# Patient Record
Sex: Female | Born: 1945 | Race: White | Hispanic: No | Marital: Married | Smoking: Former smoker
Health system: Southern US, Community
[De-identification: ages and names within clinical notes are randomized; demographics above are authoritative.]

## PROBLEM LIST (undated history)

## (undated) DIAGNOSIS — Z8639 Personal history of other endocrine, nutritional and metabolic disease: Secondary | ICD-10-CM

## (undated) DIAGNOSIS — Z9221 Personal history of antineoplastic chemotherapy: Secondary | ICD-10-CM

## (undated) DIAGNOSIS — Z7729 Contact with and (suspected ) exposure to other hazardous substances: Secondary | ICD-10-CM

## (undated) DIAGNOSIS — R112 Nausea with vomiting, unspecified: Secondary | ICD-10-CM

## (undated) DIAGNOSIS — C679 Malignant neoplasm of bladder, unspecified: Secondary | ICD-10-CM

## (undated) DIAGNOSIS — E079 Disorder of thyroid, unspecified: Secondary | ICD-10-CM

## (undated) DIAGNOSIS — R7303 Prediabetes: Secondary | ICD-10-CM

## (undated) DIAGNOSIS — E669 Obesity, unspecified: Secondary | ICD-10-CM

## (undated) DIAGNOSIS — I471 Supraventricular tachycardia, unspecified: Secondary | ICD-10-CM

## (undated) DIAGNOSIS — Z86018 Personal history of other benign neoplasm: Secondary | ICD-10-CM

## (undated) DIAGNOSIS — Z9889 Other specified postprocedural states: Secondary | ICD-10-CM

## (undated) DIAGNOSIS — E039 Hypothyroidism, unspecified: Secondary | ICD-10-CM

## (undated) HISTORY — PX: BLADDER SURGERY: SHX569

## (undated) HISTORY — DX: Disorder of thyroid, unspecified: E07.9

---
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
## 2000-01-14 ENCOUNTER — Ambulatory Visit (HOSPITAL_COMMUNITY): Admission: RE | Admit: 2000-01-14 | Discharge: 2000-01-14 | Payer: Self-pay | Admitting: Gastroenterology

## 2004-03-03 ENCOUNTER — Ambulatory Visit: Payer: Self-pay | Admitting: Unknown Physician Specialty

## 2004-04-13 DIAGNOSIS — Z9221 Personal history of antineoplastic chemotherapy: Secondary | ICD-10-CM

## 2004-04-13 DIAGNOSIS — C679 Malignant neoplasm of bladder, unspecified: Secondary | ICD-10-CM

## 2004-04-13 HISTORY — DX: Personal history of antineoplastic chemotherapy: Z92.21

## 2004-04-13 HISTORY — DX: Malignant neoplasm of bladder, unspecified: C67.9

## 2004-11-18 ENCOUNTER — Ambulatory Visit: Payer: Self-pay | Admitting: Specialist

## 2005-03-13 ENCOUNTER — Ambulatory Visit: Payer: Self-pay | Admitting: Unknown Physician Specialty

## 2006-03-16 ENCOUNTER — Ambulatory Visit: Payer: Self-pay | Admitting: Unknown Physician Specialty

## 2006-05-11 ENCOUNTER — Ambulatory Visit: Payer: Self-pay | Admitting: Gastroenterology

## 2007-03-21 ENCOUNTER — Ambulatory Visit: Payer: Self-pay | Admitting: Unknown Physician Specialty

## 2008-03-22 ENCOUNTER — Ambulatory Visit: Payer: Self-pay | Admitting: Unknown Physician Specialty

## 2008-10-17 ENCOUNTER — Ambulatory Visit: Payer: Self-pay | Admitting: Unknown Physician Specialty

## 2008-11-11 ENCOUNTER — Ambulatory Visit: Payer: Self-pay | Admitting: Unknown Physician Specialty

## 2008-12-12 ENCOUNTER — Ambulatory Visit: Payer: Self-pay | Admitting: Unknown Physician Specialty

## 2009-01-11 ENCOUNTER — Ambulatory Visit: Payer: Self-pay | Admitting: Unknown Physician Specialty

## 2009-06-27 ENCOUNTER — Ambulatory Visit: Payer: Self-pay | Admitting: Unknown Physician Specialty

## 2009-09-16 DIAGNOSIS — D239 Other benign neoplasm of skin, unspecified: Secondary | ICD-10-CM

## 2009-09-16 HISTORY — DX: Other benign neoplasm of skin, unspecified: D23.9

## 2010-07-02 ENCOUNTER — Ambulatory Visit: Payer: Self-pay | Admitting: Unknown Physician Specialty

## 2011-06-17 ENCOUNTER — Ambulatory Visit: Payer: Self-pay | Admitting: Ophthalmology

## 2011-07-07 ENCOUNTER — Ambulatory Visit: Payer: Self-pay | Admitting: Internal Medicine

## 2011-07-08 ENCOUNTER — Ambulatory Visit: Payer: Self-pay | Admitting: Ophthalmology

## 2011-10-20 ENCOUNTER — Ambulatory Visit: Payer: Self-pay | Admitting: Gastroenterology

## 2011-10-22 LAB — PATHOLOGY REPORT

## 2012-06-01 ENCOUNTER — Ambulatory Visit: Payer: Medicare Other

## 2012-06-01 ENCOUNTER — Ambulatory Visit (INDEPENDENT_AMBULATORY_CARE_PROVIDER_SITE_OTHER): Payer: Medicare Other | Admitting: Family Medicine

## 2012-06-01 VITALS — BP 176/83 | HR 70 | Temp 97.9°F | Resp 16 | Ht 64.0 in | Wt 202.4 lb

## 2012-06-01 DIAGNOSIS — S0081XA Abrasion of other part of head, initial encounter: Secondary | ICD-10-CM

## 2012-06-01 DIAGNOSIS — M79641 Pain in right hand: Secondary | ICD-10-CM

## 2012-06-01 DIAGNOSIS — S0180XA Unspecified open wound of other part of head, initial encounter: Secondary | ICD-10-CM

## 2012-06-01 NOTE — Progress Notes (Signed)
Procedure Note: Verbal consent obtained from the patient.  Local anesthesia with 1 cc Lidocaine 1% with epinephrine.  Wound scrubbed with soap and water.  Wound explored.  No foreign bodies or deep structure injury noted.  Wound closed with 5 simple interrupted sutures of 5-0 prolene.  Area cleansed and dressed.  Wound care discussed.  Pt tolerated very well.

## 2012-06-01 NOTE — Patient Instructions (Signed)
Recheck in 1 week for your hand injury. Tylenol is ok if needed. Recheck for hand and sutures in 5 days. Cool compresses as needed.  Return to the clinic or go to the nearest emergency room if any of your symptoms worsen or new symptoms occur. WOUND CARE Please return in 5 days to have your stitches/staples removed or sooner if you have concerns. Marland Kitchen Keep area clean and dry for 24 hours. Do not remove bandage, if applied. . After 24 hours, remove bandage and wash wound gently with mild soap and warm water. Reapply a new bandage after cleaning wound, if directed. . Continue daily cleansing with soap and water until stitches/staples are removed. . Do not apply any ointments or creams to the wound while stitches/staples are in place, as this may cause delayed healing. . Notify the office if you experience any of the following signs of infection: Swelling, redness, pus drainage, streaking, fever >101.0 F . Notify the office if you experience excessive bleeding that does not stop after 15-20 minutes of constant, firm pressure.    HEAD INJURY If any of the following occur notify your physician or go to the Hospital Emergency Department: . Increased drowsiness, stupor or loss of consciousness . Restlessness or convulsions (fits) . Paralysis in arms or legs . Temperature above 100 F . Vomiting . Severe headache . Blood or clear fluid dripping from the nose or ears . Stiffness of the neck . Dizziness or blurred vision . Pulsating pain in the eye . Unequal pupils of eye . Personality changes . Any other unusual symptoms PRECAUTIONS . Keep head elevated at all times for the first 24 hours (Elevate mattress if pillow is ineffective) . Do not take tranquilizers, sedatives, narcotics or alcohol . Avoid aspirin. Use only acetaminophen (e.g. Tylenol) or ibuprofen (e.g. Advil) for relief of pain. Follow directions on the bottle for dosage. . Use ice packs for comfort Have parent,  spouse, or friend awaken patient every 5 hour(s) and assess tonight.  MEDICATIONS Use medications only as directed by your physician  Return to the clinic or go to the nearest emergency room if any of your symptoms worsen or new symptoms occur.

## 2012-06-01 NOTE — Progress Notes (Signed)
Subjective:    Patient ID: Heidi Richards, female    DOB: 1945-06-01, 67 y.o.   MRN: [id number]  HPI Heidi Richards is a 67 y.o. female Here after fall with multiple areas of concern. About 5pm today - looking at vehicels at [employer] - getting out of back seat - stepped on elevated platform? - stepped on edge - lost balance - and fell forward and to R side - hit R face - above eye and hit R hand and scraped knee. No LOC, no vision changes.  No N/V, no current dizziness or lightheadedness.  No headache.  No aspirin, or other blood thinners.   R knee - burns on surface, but only spore on skin, able to bend and straighten without difficulty.   R hand dominant.  R 5th finger abrasion and bruising, swelling in that finger.  No prior finger injury.   R face pain - hurts in cheek bone below R eye, burning sensation on skin below. No change in vision, no diplopia, no difficulty with motion of eyes in visual field. No amaurosis fugax, no change in floaters or visual field. No eye pain.   Last tetanus 2011.   Review of Systems  Eyes: Negative for photophobia, pain, discharge, redness and visual disturbance.  Skin: Positive for wound.  Neurological: Negative for dizziness, syncope, facial asymmetry, speech difficulty, weakness, light-headedness and headaches.  Hematological: Does not bruise/bleed easily.       Objective:   Physical Exam  Vitals reviewed. Constitutional: She is oriented to person, place, and time. She appears well-developed and well-nourished. No distress.  HENT:  Head: Head is with abrasion and with laceration. Head is without raccoon's eyes and without Battle's sign.    Right Ear: No mastoid tenderness. No middle ear effusion. No hemotympanum.  Left Ear: No mastoid tenderness.  No middle ear effusion. No hemotympanum.  Nose: No rhinorrhea or nasal septal hematoma. Right sinus exhibits no maxillary sinus tenderness and no frontal sinus tenderness. Left sinus exhibits no  maxillary sinus tenderness and no frontal sinus tenderness.  No malaocclusion, tmj nt, no jaw ttp, or pain with opening/closing jaw.   Pulmonary/Chest: Effort normal.  Musculoskeletal:       Right elbow: She exhibits normal range of motion, no swelling and no effusion. No tenderness found. No radial head tenderness noted.       Right wrist: She exhibits normal range of motion, no tenderness, no bony tenderness, no swelling and no deformity.       Right knee: She exhibits normal range of motion, no swelling, no effusion, no ecchymosis, no deformity and no bony tenderness. No tenderness found. No medial joint line, no lateral joint line, no MCL, no LCL and no patellar tendon tenderness noted.       Hands:      Legs: Neurological: She is alert and oriented to person, place, and time.  No focal weakness.   Skin: Skin is warm.  Wounds as above.   Psychiatric: She has a normal mood and affect. Her behavior is normal. Judgment and thought content normal.    [facility] reading (PRIMARY) by  Dr. Neva Seat: R hand:subtle, possible small nondispleced fracture at 5th [facility] base - incomplete, transverse.   dJd at 5th DIP - near fusion.  :     Assessment & Plan:  Heidi Richards is a 67 y.o. female  R hand pain, contusion with possible ND R 5th MC base fx. Will place in ulnar gutter splint - wrist at 30  until overread and plan on recheck in next 5 days.  Consider ortho eval as base of 5th, but appears stable at this point. Tylenol if needed.   TR knee abrasion - sx care, soap/water, polysporin if needed, rtc precautions.   Wound - face - repaired per procedure note. Discussed head injury precautions, ER precautions, but given current history and exam - deferred imaging at this point. See handout. Understanding expressed.    Patient Instructions  Recheck in 1 week for your hand injury. Tylenol is ok if needed. Recheck for hand and sutures in 5 days. Cool compresses as needed.  Return to the clinic or go to the  nearest emergency room if any of your symptoms worsen or new symptoms occur. WOUND CARE Please return in 5 days to have your stitches/staples removed or sooner if you have concerns. Marland Kitchen Keep area clean and dry for 24 hours. Do not remove bandage, if applied. . After 24 hours, remove bandage and wash wound gently with mild soap and warm water. Reapply a new bandage after cleaning wound, if directed. . Continue daily cleansing with soap and water until stitches/staples are removed. . Do not apply any ointments or creams to the wound while stitches/staples are in place, as this may cause delayed healing. . Notify the office if you experience any of the following signs of infection: Swelling, redness, pus drainage, streaking, fever >101.0 F . Notify the office if you experience excessive bleeding that does not stop after 15-20 minutes of constant, firm pressure.    HEAD INJURY If any of the following occur notify your physician or go to the Hospital Emergency Department: . Increased drowsiness, stupor or loss of consciousness . Restlessness or convulsions (fits) . Paralysis in arms or legs . Temperature above 100 F . Vomiting . Severe headache . Blood or clear fluid dripping from the nose or ears . Stiffness of the neck . Dizziness or blurred vision . Pulsating pain in the eye . Unequal pupils of eye . Personality changes . Any other unusual symptoms PRECAUTIONS . Keep head elevated at all times for the first 24 hours (Elevate mattress if pillow is ineffective) . Do not take tranquilizers, sedatives, narcotics or alcohol . Avoid aspirin. Use only acetaminophen (e.g. Tylenol) or ibuprofen (e.g. Advil) for relief of pain. Follow directions on the bottle for dosage. . Use ice packs for comfort Have parent, spouse, or friend awaken patient every 5 hour(s) and assess tonight.  MEDICATIONS Use medications only as directed by your physician  Return to the clinic or go to the  nearest emergency room if any of your symptoms worsen or new symptoms occur.

## 2012-06-07 ENCOUNTER — Ambulatory Visit (INDEPENDENT_AMBULATORY_CARE_PROVIDER_SITE_OTHER): Payer: Medicare Other | Admitting: Family Medicine

## 2012-06-07 VITALS — BP 165/82 | HR 63 | Temp 97.9°F | Resp 16 | Ht 64.75 in | Wt 201.0 lb

## 2012-06-07 DIAGNOSIS — Z5189 Encounter for other specified aftercare: Secondary | ICD-10-CM

## 2012-06-07 DIAGNOSIS — S80211D Abrasion, right knee, subsequent encounter: Secondary | ICD-10-CM

## 2012-06-07 NOTE — Patient Instructions (Addendum)
We will refer you to an orthopaedic doctor for your hand fracture.  Keep the splint on until seen by them.   allow the steristrips to fall off on their own., and avoid scrubbing this area next few days.  Continue soap and water for your right knee, and if any redness , or continued pain in the next 1-2 weeks - recheck.  Return to the clinic or go to the nearest emergency room if any of your symptoms worsen or new symptoms occur.

## 2012-06-07 NOTE — Progress Notes (Signed)
Subjective:    Patient ID: Heidi Richards, female    DOB: [birth year], 67 y.o.   MRN: [id number]  HPI Heidi Richards is a 67 y.o. female See prior ov, s/p fall on 2/19.   R eye injury  - had sutures removed today.  No visual difficulty. Bruising on face is changing to yellow green. Only sore in soft tissue around eye. No bony pain. No recent ha/lightheadedness, no dizziness.   R wrist pain - R hand pain, contusion with possible ND R 5th MC base fx.  XR reading confirmed nd fx: RIGHT HAND - COMPLETE 3+ VIEW  Comparison: None.  Findings: There is a nondisplaced fracture of the base of the fifth  metacarpal. There are moderate osteoarthritic type degenerative  changes mainly involving the DIP joints and most significantly  involving the second and fifth digits.  IMPRESSION:  1. Suspect fifth metatarsal base fracture.  2. Osteoarthritic type degenerative changes.   R  knee abrasion - sx care, soap/water, polysporin if needed, rtc precautions.     Review of Systems  Constitutional: Negative for fever and chills.  HENT: Negative for facial swelling.   Eyes: Negative for photophobia and visual disturbance.  Musculoskeletal: Positive for arthralgias.  Skin:       Bruising on face.        Objective:   Physical Exam  Constitutional: She is oriented to person, place, and time. She appears well-developed and well-nourished. No distress.  HENT:  Right Ear: External ear normal.  Left Ear: External ear normal.  Mouth/Throat: Oropharynx is clear and moist.  Resolving echymosis R face, without bony ttp.  Wound above r scalp with sutures removed,  No erythema or exudate, slight dried blood midline. Steri-strips applied x 2.   Eyes: Conjunctivae and EOM are normal. Pupils are equal, round, and reactive to light. Right eye exhibits no discharge. No scleral icterus.  Pulmonary/Chest: Effort normal.  Musculoskeletal:  R ulnar gutter splint intact, nvi distally.  Few healing abrasions over R knee,  no surrounding erythema. No bony ttp, and from,  Possible slight effusion. Able to ambulate without pain.   Neurological: She is alert and oriented to person, place, and time.      Assessment & Plan:  Heidi Richards is a 67 y.o. female Nondisp fx base fifth metacarpal bone right hand w/routine heal - Plan: Ambulatory referral to Orthopedic Surgery as base of 5th and possible instability, but initial fx nondisplaced. Cont brace and refer to ortho in [place] at her request.   Pain, hand joint, right  - as above.   Wound, open, face, subsequent encounter.  Sutures removed  Without difficulty, wound care precautions, rtc precautions.   Knee abrasion, right, subsequent encounter - contusion.abrasion. Possible slight effusion - ?flair of OA, but no bony ttp.  Recheck in next 2 weeks if still with swelling, sooner if pain or worsening.   Patient Instructions  We will refer you to an orthopaedic doctor for your hand fracture.  Keep the splint on until seen by them.   allow the steristrips to fall off on their own., and avoid scrubbing this area next few days.  Continue soap and water for your right knee, and if any redness , or continued pain in the next 1-2 weeks - recheck.  Return to the clinic or go to the nearest emergency room if any of your symptoms worsen or new symptoms occur.     No orders of the defined types were placed in this encounter.

## 2012-06-08 ENCOUNTER — Telehealth: Payer: Self-pay

## 2012-06-08 NOTE — Telephone Encounter (Signed)
Request given to xray 

## 2012-06-08 NOTE — Telephone Encounter (Signed)
Pt needs to pick up xrays of her hand to take to appt on Friday please call when ready to pick up

## 2012-07-07 ENCOUNTER — Ambulatory Visit: Payer: Self-pay

## 2012-11-28 ENCOUNTER — Ambulatory Visit: Payer: Self-pay

## 2012-12-12 ENCOUNTER — Ambulatory Visit: Payer: Self-pay

## 2013-01-11 ENCOUNTER — Ambulatory Visit: Payer: Self-pay

## 2013-06-07 IMAGING — MG MM CAD SCREENING MAMMO
1 series · 4 of 4 positions shown · non-contrast
Comparison: none

REASON FOR EXAM: SCR MAMMO NO ORDER
COMMENTS:

[R CC · right · 4 of 4 slices shown]
[im 1/4]
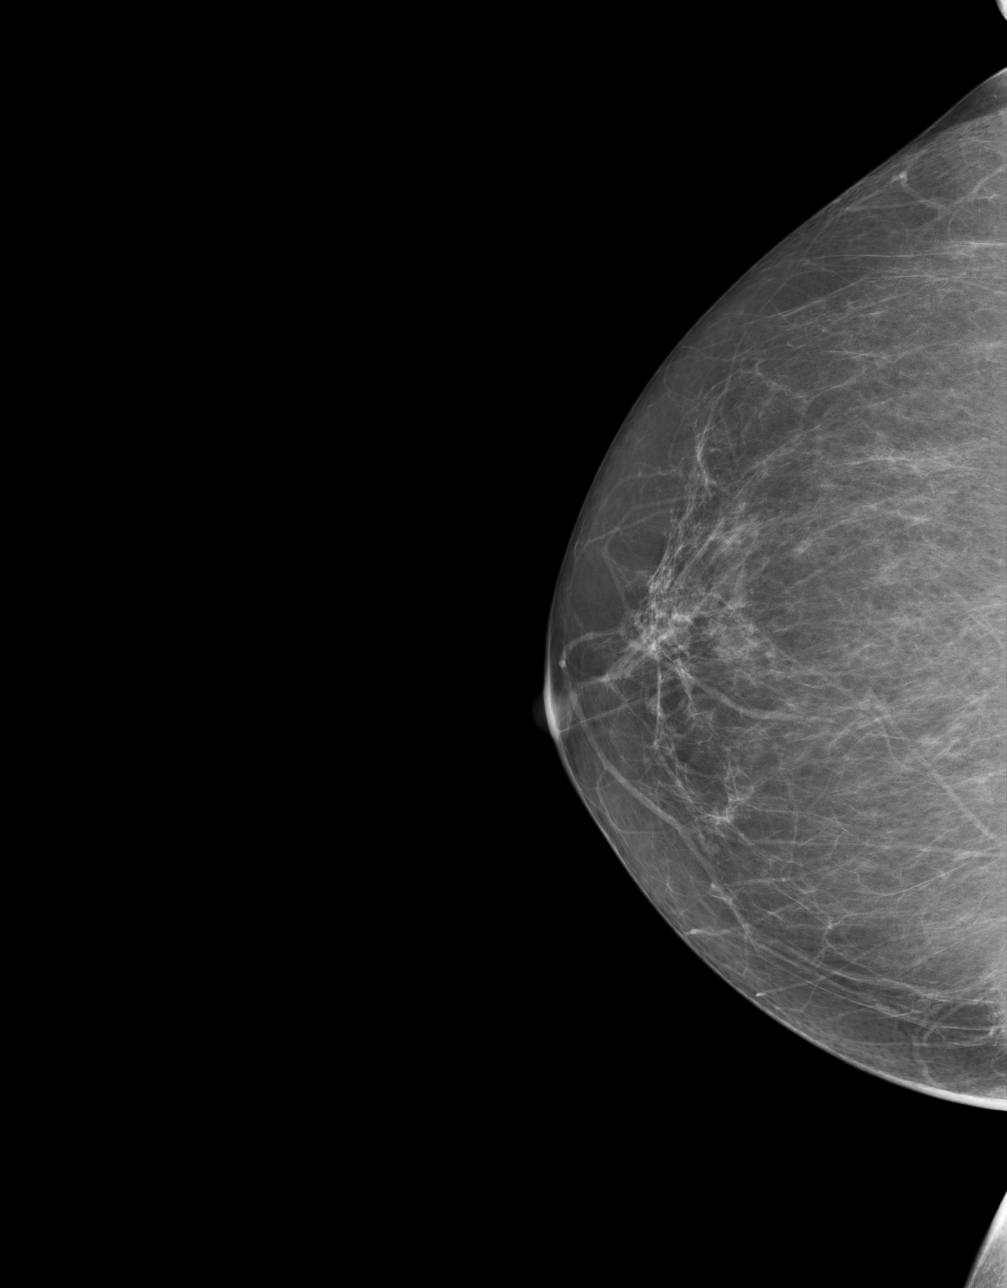
[im 2/4]
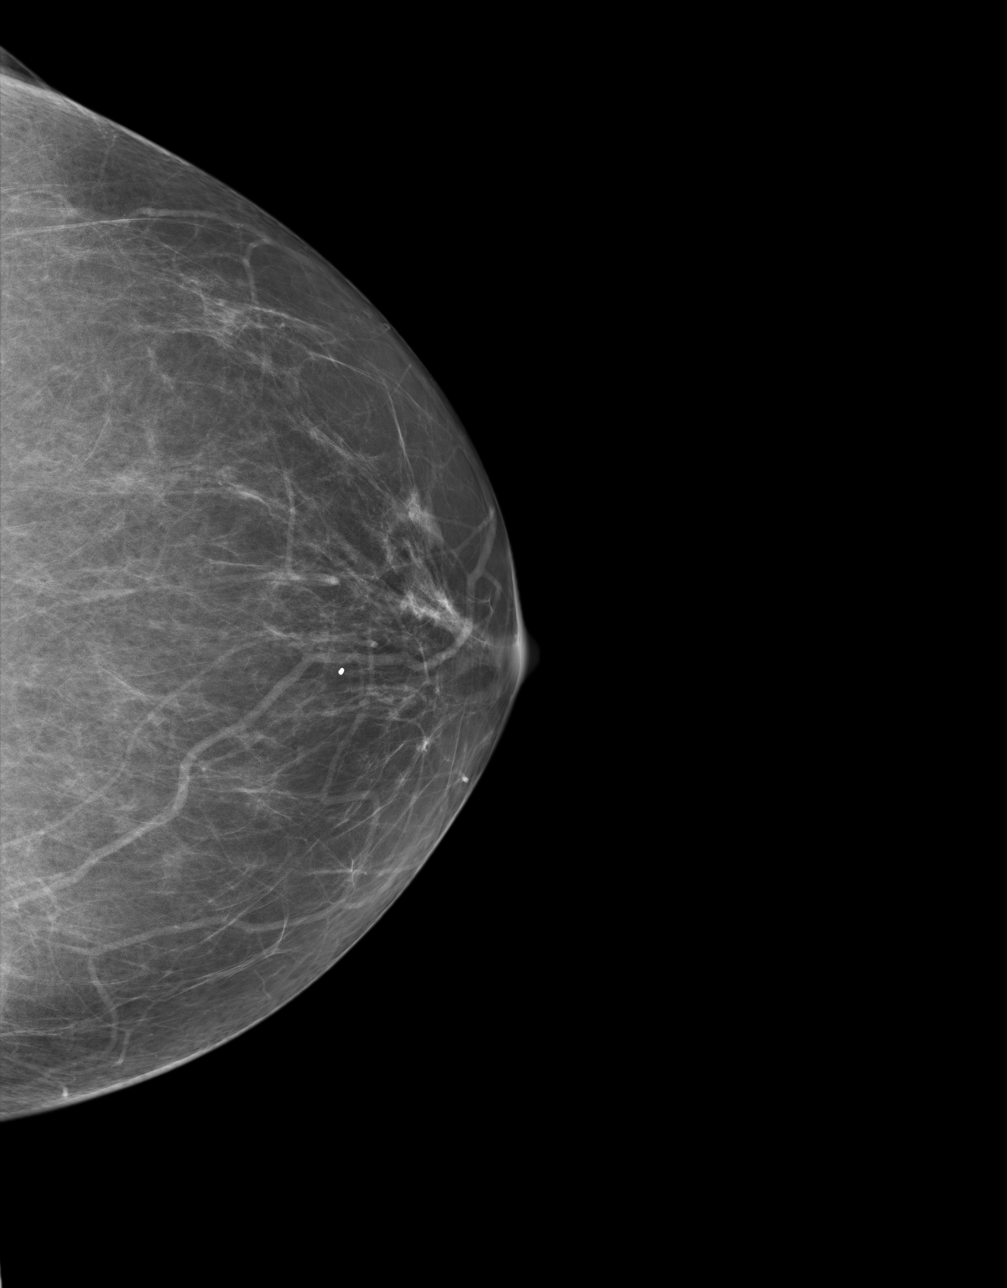
[im 3/4]
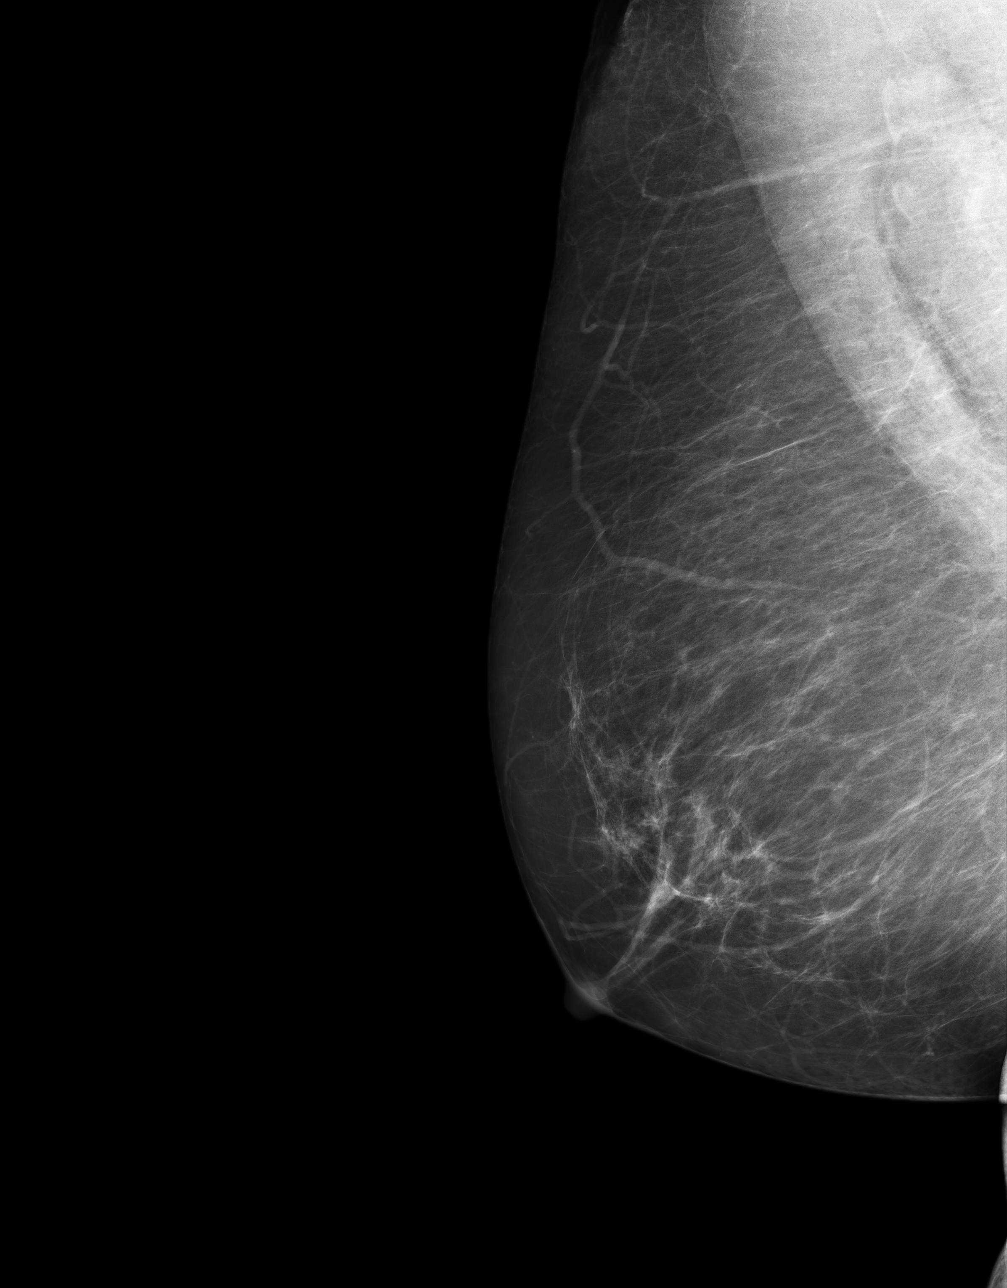
[im 4/4]
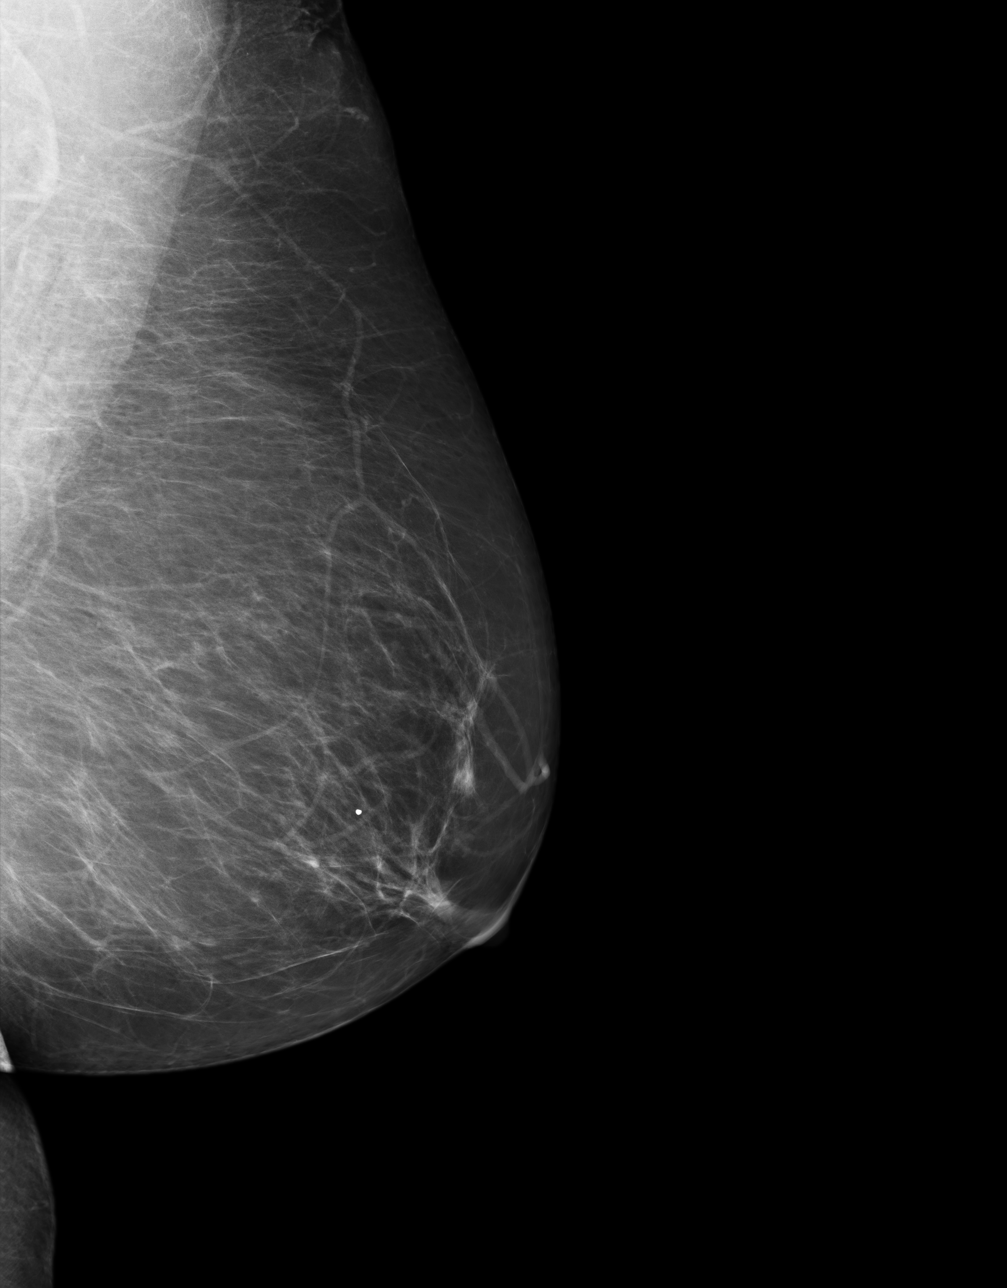

[4 of 4 positions shown; findings below may reference images not displayed]

PROCEDURE:     MAM - MAM DGTL SCRN MAM NO ORDER W/CAD  - July 07, 2012 [DATE]

RESULT:     There is a family history of breast cancer in the patient's
sister. The patient reports a history of bladder cancer. The patient denies
previous breast cancer history or breast surgery. Comparison is made to
previous digital images dated 07 July, 2011 as well as 02 July, 2010 and 02 March, 2003. There is predominant fatty replacement of the breast
parenchyma without malignant calcification, architectural distortion or
developing density.
IMPRESSION: Stable, benign appearing bilateral mammogram. Please
continue to encourage annual mammographic followup and monthly breast self
exam. BREAST COMPOSITION: The breast composition is ALMOST ENTIRELY FATTY
(glandular tissue is less than 25%) BI-RADS: Category 1 - Negative

A NEGATIVE MAMMOGRAM REPORT DOES NOT PRECLUDE BIOPSY OR OTHER EVALUATION OF
A CLINICALLY PALPABLE OR OTHERWISE SUSPICIOUS MASS OR LESION. BREAST CANCER
MAY NOT BE DETECTED IN UP TO 10% OF CASES.

[REDACTED]

## 2013-07-10 ENCOUNTER — Ambulatory Visit: Payer: Self-pay

## 2014-07-12 ENCOUNTER — Ambulatory Visit
Admit: 2014-07-12 | Disposition: A | Discharge status: Home / Self Care | Payer: Self-pay | Attending: Infectious Diseases | Admitting: Infectious Diseases

## 2015-07-04 ENCOUNTER — Other Ambulatory Visit: Payer: Self-pay | Admitting: Infectious Diseases

## 2015-07-04 DIAGNOSIS — Z1231 Encounter for screening mammogram for malignant neoplasm of breast: Secondary | ICD-10-CM

## 2015-07-15 ENCOUNTER — Other Ambulatory Visit: Payer: Self-pay | Admitting: Infectious Diseases

## 2015-07-15 ENCOUNTER — Ambulatory Visit
Admission: RE | Admit: 2015-07-15 | Discharge: 2015-07-15 | Disposition: A | Discharge status: Home / Self Care | Payer: Medicare Other | Source: Ambulatory Visit | Attending: Infectious Diseases | Admitting: Infectious Diseases

## 2015-07-15 DIAGNOSIS — Z1231 Encounter for screening mammogram for malignant neoplasm of breast: Secondary | ICD-10-CM | POA: Diagnosis not present

## 2016-07-14 ENCOUNTER — Other Ambulatory Visit: Payer: Self-pay | Admitting: Infectious Diseases

## 2016-07-14 DIAGNOSIS — Z1231 Encounter for screening mammogram for malignant neoplasm of breast: Secondary | ICD-10-CM

## 2016-07-17 ENCOUNTER — Ambulatory Visit
Admission: RE | Admit: 2016-07-17 | Discharge: 2016-07-17 | Disposition: A | Discharge status: Home / Self Care | Payer: Medicare Other | Source: Ambulatory Visit | Attending: Infectious Diseases | Admitting: Infectious Diseases

## 2016-07-17 DIAGNOSIS — Z1231 Encounter for screening mammogram for malignant neoplasm of breast: Secondary | ICD-10-CM | POA: Diagnosis present

## 2017-04-12 HISTORY — PX: EYE SURGERY: SHX253

## 2017-06-18 ENCOUNTER — Encounter: Payer: Self-pay | Admitting: *Deleted

## 2017-06-21 ENCOUNTER — Ambulatory Visit: Payer: Medicare Other | Admitting: Certified Registered Nurse Anesthetist

## 2017-06-21 ENCOUNTER — Encounter
Admission: RE | Disposition: A | Discharge status: Home / Self Care | Payer: Self-pay | Source: Ambulatory Visit | Attending: Gastroenterology

## 2017-06-21 ENCOUNTER — Other Ambulatory Visit: Payer: Self-pay

## 2017-06-21 ENCOUNTER — Ambulatory Visit
Admission: RE | Admit: 2017-06-21 | Discharge: 2017-06-21 | Disposition: A | Discharge status: Home / Self Care | Payer: Medicare Other | Source: Ambulatory Visit | Attending: Gastroenterology | Admitting: Gastroenterology

## 2017-06-21 DIAGNOSIS — Z79899 Other long term (current) drug therapy: Secondary | ICD-10-CM | POA: Insufficient documentation

## 2017-06-21 DIAGNOSIS — I10 Essential (primary) hypertension: Secondary | ICD-10-CM | POA: Insufficient documentation

## 2017-06-21 DIAGNOSIS — Z882 Allergy status to sulfonamides status: Secondary | ICD-10-CM | POA: Insufficient documentation

## 2017-06-21 DIAGNOSIS — Z86010 Personal history of colonic polyps: Secondary | ICD-10-CM | POA: Diagnosis not present

## 2017-06-21 DIAGNOSIS — E039 Hypothyroidism, unspecified: Secondary | ICD-10-CM | POA: Diagnosis not present

## 2017-06-21 DIAGNOSIS — Z881 Allergy status to other antibiotic agents status: Secondary | ICD-10-CM | POA: Insufficient documentation

## 2017-06-21 DIAGNOSIS — Z800 Family history of malignant neoplasm of digestive organs: Secondary | ICD-10-CM | POA: Diagnosis present

## 2017-06-21 DIAGNOSIS — I471 Supraventricular tachycardia: Secondary | ICD-10-CM | POA: Insufficient documentation

## 2017-06-21 DIAGNOSIS — Z8551 Personal history of malignant neoplasm of bladder: Secondary | ICD-10-CM | POA: Diagnosis not present

## 2017-06-21 DIAGNOSIS — R7303 Prediabetes: Secondary | ICD-10-CM | POA: Insufficient documentation

## 2017-06-21 DIAGNOSIS — Z87891 Personal history of nicotine dependence: Secondary | ICD-10-CM | POA: Diagnosis not present

## 2017-06-21 HISTORY — DX: Nausea with vomiting, unspecified: R11.2

## 2017-06-21 HISTORY — DX: Personal history of other endocrine, nutritional and metabolic disease: Z86.39

## 2017-06-21 HISTORY — DX: Supraventricular tachycardia: I47.1

## 2017-06-21 HISTORY — DX: Hypothyroidism, unspecified: E03.9

## 2017-06-21 HISTORY — DX: Prediabetes: R73.03

## 2017-06-21 HISTORY — DX: Contact with and (suspected) exposure to other hazardous substances: Z77.29

## 2017-06-21 HISTORY — PX: COLONOSCOPY WITH PROPOFOL: SHX5780

## 2017-06-21 HISTORY — DX: Supraventricular tachycardia, unspecified: I47.10

## 2017-06-21 HISTORY — DX: Malignant neoplasm of bladder, unspecified: C67.9

## 2017-06-21 HISTORY — DX: Obesity, unspecified: E66.9

## 2017-06-21 HISTORY — DX: Other specified postprocedural states: Z98.890

## 2017-06-21 LAB — GLUCOSE, CAPILLARY: Glucose-Capillary: 121 mg/dL — ABNORMAL HIGH (ref 65–99)

## 2017-06-21 SURGERY — COLONOSCOPY WITH PROPOFOL
Anesthesia: General

## 2017-06-21 MED ORDER — SODIUM CHLORIDE 0.9 % IV SOLN
INTRAVENOUS | Status: DC
  Administered 2017-06-21: 08:00:00 via INTRAVENOUS

## 2017-06-21 MED ORDER — KETAMINE HCL 50 MG/ML IJ SOLN
INTRAMUSCULAR | Status: AC
  Filled 2017-06-21: qty 10

## 2017-06-21 MED ORDER — ONDANSETRON HCL 4 MG/2ML IJ SOLN
INTRAMUSCULAR | Status: AC
  Filled 2017-06-21: qty 2

## 2017-06-21 MED ORDER — ONDANSETRON HCL 4 MG/2ML IJ SOLN
INTRAMUSCULAR | Status: DC | PRN
  Administered 2017-06-21: 4 mg via INTRAVENOUS

## 2017-06-21 MED ORDER — PROPOFOL 10 MG/ML IV BOLUS
INTRAVENOUS | Status: DC | PRN
  Administered 2017-06-21: 70 mg via INTRAVENOUS
  Administered 2017-06-21: 20 mg via INTRAVENOUS

## 2017-06-21 MED ORDER — LIDOCAINE HCL (PF) 2 % IJ SOLN
INTRAMUSCULAR | Status: AC
  Filled 2017-06-21: qty 10

## 2017-06-21 MED ORDER — PROPOFOL 10 MG/ML IV BOLUS
INTRAVENOUS | Status: AC
  Filled 2017-06-21: qty 20

## 2017-06-21 MED ORDER — LIDOCAINE HCL (CARDIAC) 20 MG/ML IV SOLN
INTRAVENOUS | Status: DC | PRN
  Administered 2017-06-21: 50 mg via INTRAVENOUS

## 2017-06-21 MED ORDER — ONDANSETRON HCL 4 MG/2ML IJ SOLN: 4.0000 mg | Freq: Once | INTRAMUSCULAR | Status: DC | PRN

## 2017-06-21 MED ORDER — FENTANYL CITRATE (PF) 100 MCG/2ML IJ SOLN: 25.0000 ug | INTRAMUSCULAR | Status: DC | PRN

## 2017-06-21 NOTE — Anesthesia Post-op Follow-up Note (Signed)
Anesthesia QCDR form completed.        

## 2017-06-21 NOTE — H&P (Signed)
Outpatient short stay form Pre-procedure 06/21/2017 7:30 AM Heidi Sails MD  Primary Physician: Dr. Adrian Prows  Reason for visit: Colonoscopy  History of present illness: Patient is a 72 year old female presenting today as above.  She has a family history of colon cancer and 2 primary relatives, father and brother.  Patient's last colonoscopy was 10/20/2011 with finding of a hyperplastic polyp.  She tolerated her prep well.  She takes no aspirin or blood thinning agent.    Current Facility-Administered Medications:  .  0.9 %  sodium chloride infusion, , Intravenous, Continuous, Heidi Sails, MD .  0.9 %  sodium chloride infusion, , Intravenous, Continuous, Heidi Sails, MD  Medications Prior to Admission  Medication Sig Dispense Refill Last Dose  . levothyroxine (SYNTHROID, LEVOTHROID) 88 MCG tablet Take 88 mcg by mouth daily.   06/20/2017 at Unknown time  . metoprolol tartrate (LOPRESSOR) 25 MG tablet Take 25 mg by mouth 2 (two) times daily.   Not Taking at Unknown time  . pantoprazole (PROTONIX) 40 MG tablet Take 40 mg by mouth daily.   Not Taking at Unknown time  . simvastatin (ZOCOR) 10 MG tablet Take 10 mg by mouth at bedtime.   Not Taking at Unknown time     Allergies  Allergen Reactions  . Ciprofloxacin   . Fioricet [Butalbital-Apap-Caffeine]   . Sulfa Antibiotics      Past Medical History:  Diagnosis Date  . Bladder cancer ([place])   . Cancer ([place]) 2006  . Carbon monoxide exposure   . History of elevated lipids   . Hypothyroidism   . Obesity   . PONV (postoperative nausea and vomiting)    nausea and vomiting  . Pre-diabetes   . SVT (supraventricular tachycardia) ([place])   . Thyroid disease    hypothyroid'    Review of systems:      Physical Exam    Heart and lungs: Regular rate and rhythm without rub or gallop, lungs are bilaterally clear.    HEENT: Normocephalic atraumatic eyes are anicteric    Other:    Pertinant exam for procedure:  Soft nontender nondistended bowel sounds positive and normoactive.    Planned proceedures: Colonoscopy and indicated procedures. I have discussed the risks benefits and complications of procedures to include not limited to bleeding, infection, perforation and the risk of sedation and the patient wishes to proceed.    Heidi Sails, MD Gastroenterology 06/21/2017  7:30 AM

## 2017-06-21 NOTE — Anesthesia Postprocedure Evaluation (Signed)
Anesthesia Post Note  Patient: Livingston Diones  Procedure(s) Performed: COLONOSCOPY WITH PROPOFOL (N/A )  Patient location during evaluation: Endoscopy Anesthesia Type: General Level of consciousness: awake and alert Pain management: pain level controlled Vital Signs Assessment: post-procedure vital signs reviewed and stable Respiratory status: spontaneous breathing, nonlabored ventilation, respiratory function stable and patient connected to nasal cannula oxygen Cardiovascular status: blood pressure returned to baseline and stable Postop Assessment: no apparent nausea or vomiting Anesthetic complications: no     Last Vitals:  Vitals:   06/21/17 0818 06/21/17 0820  BP: (!) 128/59   Pulse: 64   Resp: 16   Temp:    SpO2: 97% 97%    Last Pain: There were no vitals filed for this visit.               Precious Haws Piscit[place]

## 2017-06-21 NOTE — Anesthesia Preprocedure Evaluation (Addendum)
Anesthesia Evaluation  Patient identified by MRN, date of birth, ID band Patient awake    Reviewed: Allergy & Precautions, H&P , NPO status , Patient's Chart, lab work & pertinent test results, reviewed documented beta blocker date and time   History of Anesthesia Complications (+) PONV and history of anesthetic complications  Airway Mallampati: II  TM Distance: <3 FB Neck ROM: limited    Dental  (+) Chipped   Pulmonary neg shortness of breath, sleep apnea , former smoker,    Pulmonary exam normal        Cardiovascular Exercise Tolerance: Good hypertension, Pt. on medications and Pt. on home beta blockers (-) angina+ Past MI  Normal cardiovascular exam+ dysrhythmias Supra Ventricular Tachycardia      Neuro/Psych negative neurological ROS  negative psych ROS   GI/Hepatic Neg liver ROS, GERD  Medicated and Controlled,  Endo/Other  diabetes, Type 2Hypothyroidism   Renal/GU negative Renal ROS  negative genitourinary   Musculoskeletal   Abdominal Normal abdominal exam  (+)   Peds negative pediatric ROS (+)  Hematology negative hematology ROS (+)   Anesthesia Other Findings Past Medical History: No date: Bladder cancer ([place]) 2006: Cancer ([place]) No date: Carbon monoxide exposure No date: History of elevated lipids No date: Hypothyroidism No date: Obesity No date: PONV (postoperative nausea and vomiting)     Comment:  nausea and vomiting No date: Pre-diabetes No date: SVT (supraventricular tachycardia) ([facility]) No date: Thyroid disease     Comment:  hypothyroid'  Past Surgical History: No date: BLADDER SURGERY 04/12/2017: EYE SURGERY     Comment:  detattached retina  BMI    Body Mass Index:  33.29 kg/m      Reproductive/Obstetrics negative OB ROS                            Anesthesia Physical Anesthesia Plan  ASA: III  Anesthesia Plan: General   Post-op Pain Management:     Induction: Intravenous  PONV Risk Score and Plan: Propofol infusion and TIVA  Airway Management Planned: Nasal Cannula  Additional Equipment:   Intra-op Plan:   Post-operative Plan:   Informed Consent: I have reviewed the patients History and Physical, chart, labs and discussed the procedure including the risks, benefits and alternatives for the proposed anesthesia with the patient or authorized representative who has indicated his/her understanding and acceptance.   Dental advisory given  Plan Discussed with: Anesthesiologist, CRNA and Surgeon  Anesthesia Plan Comments: (Patient consented for risks of anesthesia including but not limited to:  - adverse reactions to medications - risk of intubation if required - damage to teeth, lips or other oral mucosa - sore throat or hoarseness - Damage to heart, brain, lungs or loss of life  Patient voiced understanding.)       Anesthesia Quick Evaluation

## 2017-06-21 NOTE — Brief Op Note (Signed)
Colonoscopy aborted at sigmoid colon due to poor prep. 

## 2017-06-21 NOTE — Transfer of Care (Signed)
Immediate Anesthesia Transfer of Care Note  Patient: Heidi Richards  Procedure(s) Performed: COLONOSCOPY WITH PROPOFOL (N/A )  Patient Location: PACU and Endoscopy Unit  Anesthesia Type:General  Level of Consciousness: sedated  Airway & Oxygen Therapy: Patient Spontanous Breathing  Post-op Assessment: Report given to RN  Post vital signs: stable  Last Vitals:  Vitals:   06/21/17 0706 06/21/17 0758  BP: (!) 163/96 (!) 110/53  Pulse: (!) 58 (!) 55  Resp: 16 16  Temp: (!) 36 C (!) 35.8 C  SpO2:  100%    Last Pain: There were no vitals filed for this visit.       Complications: No apparent anesthesia complications

## 2017-06-21 NOTE — Op Note (Signed)
[facility] Gastroenterology Patient Name: Heidi Richards Procedure Date: 06/21/2017 7:34 AM MRN: [id number] Account #: [id number] Date of Birth: November 11, 1945 Admit Type: Outpatient Age: 72 Room: [facility] ENDO ROOM 1 Gender: Female Note Status: Finalized Procedure:            Colonoscopy Indications:          Family history of colon cancer in multiple first-degree                        relatives Providers:            Lollie Sails, MD Referring MD:         Adrian Prows (Referring MD) Medicines:            Monitored Anesthesia Care Complications:        No immediate complications. Procedure:            Pre-Anesthesia Assessment:                       - ASA Grade Assessment: III - A patient with severe                        systemic disease.                       After obtaining informed consent, the colonoscope was                        passed under direct vision. Throughout the procedure,                        the patient's blood pressure, pulse, and oxygen                        saturations were monitored continuously. The                        Colonoscope was introduced through the anus with the                        intention of advancing to the cecum. The scope was                        advanced to the sigmoid colon before the procedure was                        aborted. Medications were given. The patient tolerated                        the procedure well. The quality of the bowel                        preparation was poor. Findings:      A large amount of semi-liquid solid stool was found in the rectum and in       the sigmoid colon, precluding visualization.      The digital rectal exam was normal. Impression:           - Preparation of the colon was poor.                       -  Stool in the rectum and in the sigmoid colon.                       - No specimens collected. Recommendation:       - Discharge patient to home.   - repeat prep and reschedule. Procedure Code(s):    --- Professional ---                       [phone], 60, Colonoscopy, flexible; diagnostic, including                        collection of specimen(s) by brushing or washing, when                        performed (separate procedure) Diagnosis Code(s):    --- Professional ---                       Z80.0, Family history of malignant neoplasm of                        digestive organs CPT copyright 2016 [employer]. All rights reserved. The codes documented in this report are preliminary and upon coder review may  be revised to meet current compliance requirements. Lollie Sails, MD 06/21/2017 7:56:32 AM This report has been signed electronically. Number of Addenda: 0 Note Initiated On: 06/21/2017 7:34 AM Total Procedure Duration: 0 hours 5 minutes 50 seconds       [facility]

## 2017-06-22 ENCOUNTER — Encounter: Payer: Self-pay | Admitting: Gastroenterology

## 2017-06-28 ENCOUNTER — Other Ambulatory Visit: Payer: Self-pay | Admitting: Infectious Diseases

## 2017-06-28 DIAGNOSIS — Z1231 Encounter for screening mammogram for malignant neoplasm of breast: Secondary | ICD-10-CM

## 2017-07-14 ENCOUNTER — Other Ambulatory Visit: Payer: Self-pay | Admitting: Infectious Diseases

## 2017-07-14 DIAGNOSIS — C679 Malignant neoplasm of bladder, unspecified: Secondary | ICD-10-CM

## 2017-07-20 ENCOUNTER — Ambulatory Visit
Admission: RE | Admit: 2017-07-20 | Discharge: 2017-07-20 | Disposition: A | Discharge status: Home / Self Care | Payer: Medicare Other | Source: Ambulatory Visit | Attending: Infectious Diseases | Admitting: Infectious Diseases

## 2017-07-20 DIAGNOSIS — Z1231 Encounter for screening mammogram for malignant neoplasm of breast: Secondary | ICD-10-CM | POA: Diagnosis present

## 2017-07-20 HISTORY — DX: Personal history of antineoplastic chemotherapy: Z92.21

## 2017-07-22 ENCOUNTER — Ambulatory Visit
Admission: RE | Admit: 2017-07-22 | Discharge: 2017-07-22 | Disposition: A | Discharge status: Home / Self Care | Payer: Medicare Other | Source: Ambulatory Visit | Attending: Infectious Diseases | Admitting: Infectious Diseases

## 2017-07-22 DIAGNOSIS — C679 Malignant neoplasm of bladder, unspecified: Secondary | ICD-10-CM | POA: Diagnosis present

## 2017-07-22 DIAGNOSIS — E7800 Pure hypercholesterolemia, unspecified: Secondary | ICD-10-CM | POA: Diagnosis not present

## 2017-08-24 ENCOUNTER — Ambulatory Visit
Admission: RE | Admit: 2017-08-24 | Discharge: 2017-08-24 | Disposition: A | Discharge status: Home / Self Care | Payer: Medicare Other | Source: Ambulatory Visit | Attending: Gastroenterology | Admitting: Gastroenterology

## 2017-08-24 ENCOUNTER — Ambulatory Visit: Payer: Medicare Other | Admitting: Anesthesiology

## 2017-08-24 ENCOUNTER — Encounter
Admission: RE | Disposition: A | Discharge status: Home / Self Care | Payer: Self-pay | Source: Ambulatory Visit | Attending: Gastroenterology

## 2017-08-24 ENCOUNTER — Encounter: Payer: Self-pay | Admitting: Anesthesiology

## 2017-08-24 DIAGNOSIS — E785 Hyperlipidemia, unspecified: Secondary | ICD-10-CM | POA: Insufficient documentation

## 2017-08-24 DIAGNOSIS — Z87891 Personal history of nicotine dependence: Secondary | ICD-10-CM | POA: Insufficient documentation

## 2017-08-24 DIAGNOSIS — Z881 Allergy status to other antibiotic agents status: Secondary | ICD-10-CM | POA: Insufficient documentation

## 2017-08-24 DIAGNOSIS — Z79899 Other long term (current) drug therapy: Secondary | ICD-10-CM | POA: Insufficient documentation

## 2017-08-24 DIAGNOSIS — Z6833 Body mass index (BMI) 33.0-33.9, adult: Secondary | ICD-10-CM | POA: Insufficient documentation

## 2017-08-24 DIAGNOSIS — Z800 Family history of malignant neoplasm of digestive organs: Secondary | ICD-10-CM | POA: Insufficient documentation

## 2017-08-24 DIAGNOSIS — E119 Type 2 diabetes mellitus without complications: Secondary | ICD-10-CM | POA: Insufficient documentation

## 2017-08-24 DIAGNOSIS — K644 Residual hemorrhoidal skin tags: Secondary | ICD-10-CM | POA: Insufficient documentation

## 2017-08-24 DIAGNOSIS — K635 Polyp of colon: Secondary | ICD-10-CM | POA: Insufficient documentation

## 2017-08-24 DIAGNOSIS — E039 Hypothyroidism, unspecified: Secondary | ICD-10-CM | POA: Diagnosis not present

## 2017-08-24 DIAGNOSIS — Z8551 Personal history of malignant neoplasm of bladder: Secondary | ICD-10-CM | POA: Diagnosis not present

## 2017-08-24 DIAGNOSIS — Z79890 Hormone replacement therapy (postmenopausal): Secondary | ICD-10-CM | POA: Diagnosis not present

## 2017-08-24 DIAGNOSIS — Z1211 Encounter for screening for malignant neoplasm of colon: Secondary | ICD-10-CM | POA: Diagnosis not present

## 2017-08-24 DIAGNOSIS — K219 Gastro-esophageal reflux disease without esophagitis: Secondary | ICD-10-CM | POA: Diagnosis not present

## 2017-08-24 DIAGNOSIS — Z882 Allergy status to sulfonamides status: Secondary | ICD-10-CM | POA: Insufficient documentation

## 2017-08-24 DIAGNOSIS — K640 First degree hemorrhoids: Secondary | ICD-10-CM | POA: Insufficient documentation

## 2017-08-24 DIAGNOSIS — E669 Obesity, unspecified: Secondary | ICD-10-CM | POA: Diagnosis not present

## 2017-08-24 HISTORY — PX: COLONOSCOPY WITH PROPOFOL: SHX5780

## 2017-08-24 SURGERY — COLONOSCOPY WITH PROPOFOL
Anesthesia: General

## 2017-08-24 MED ORDER — GLYCOPYRROLATE 0.2 MG/ML IJ SOLN
INTRAMUSCULAR | Status: DC | PRN
  Administered 2017-08-24: 0.2 mg via INTRAVENOUS

## 2017-08-24 MED ORDER — FENTANYL CITRATE (PF) 100 MCG/2ML IJ SOLN
INTRAMUSCULAR | Status: AC
  Filled 2017-08-24: qty 2

## 2017-08-24 MED ORDER — SODIUM CHLORIDE 0.9 % IV SOLN
INTRAVENOUS | Status: DC
  Administered 2017-08-24: 1000 mL via INTRAVENOUS

## 2017-08-24 MED ORDER — ONDANSETRON HCL 4 MG/2ML IJ SOLN
INTRAMUSCULAR | Status: AC
  Filled 2017-08-24: qty 2

## 2017-08-24 MED ORDER — PROPOFOL 500 MG/50ML IV EMUL
INTRAVENOUS | Status: DC | PRN
  Administered 2017-08-24: 100 ug/kg/min via INTRAVENOUS

## 2017-08-24 MED ORDER — PROPOFOL 500 MG/50ML IV EMUL
INTRAVENOUS | Status: AC
  Filled 2017-08-24: qty 50

## 2017-08-24 MED ORDER — MIDAZOLAM HCL 2 MG/2ML IJ SOLN
INTRAMUSCULAR | Status: DC | PRN
  Administered 2017-08-24: 2 mg via INTRAVENOUS

## 2017-08-24 MED ORDER — ONDANSETRON HCL 4 MG/2ML IJ SOLN
INTRAMUSCULAR | Status: DC | PRN
  Administered 2017-08-24: 4 mg via INTRAVENOUS

## 2017-08-24 MED ORDER — FENTANYL CITRATE (PF) 100 MCG/2ML IJ SOLN
INTRAMUSCULAR | Status: DC | PRN
  Administered 2017-08-24 (×2): 50 ug via INTRAVENOUS

## 2017-08-24 MED ORDER — MIDAZOLAM HCL 2 MG/2ML IJ SOLN
INTRAMUSCULAR | Status: AC
  Filled 2017-08-24: qty 2

## 2017-08-24 MED ORDER — GLYCOPYRROLATE 0.2 MG/ML IJ SOLN
INTRAMUSCULAR | Status: AC
  Filled 2017-08-24: qty 1

## 2017-08-24 NOTE — Op Note (Signed)
[facility] Patient Name: Heidi Richards Procedure Date: 08/24/2017 1:43 PM MRN: [id number] Account #: [id number] Date of Birth: 02-03-1946 Admit Type: Outpatient Age: 72 Room: [facility] ENDO ROOM 3 Gender: Female Note Status: Finalized Procedure:            Colonoscopy Indications:          Family history of colon cancer in multiple first-degree                        relatives Providers:            Lollie Sails, MD Referring MD:         Adrian Prows (Referring MD) Medicines:            Monitored Anesthesia Care Complications:        No immediate complications. Procedure:            Pre-Anesthesia Assessment:                       - ASA Grade Assessment: III - A patient with severe                        systemic disease.                       After obtaining informed consent, the colonoscope was                        passed under direct vision. Throughout the procedure,                        the patient's blood pressure, pulse, and oxygen                        saturations were monitored continuously. The                        Colonoscope was introduced through the anus and                        advanced to the the cecum, identified by appendiceal                        orifice and ileocecal valve. The colonoscopy was                        performed with moderate difficulty due to a tortuous                        colon. Successful completion of the procedure was aided                        by using manual pressure. The patient tolerated the                        procedure well. The quality of the bowel preparation                        was good. Findings:      A 2 mm polyp was found in the transverse colon. The polyp was sessile.  The polyp was removed with a cold biopsy forceps. Resection and       retrieval were complete.      Non-bleeding internal hemorrhoids were found during anoscopy. The       hemorrhoids were small and  Grade I (internal hemorrhoids that do not       prolapse).      Skin tags were found on perianal exam.      The digital rectal exam was normal otherwise. Impression:           - One 2 mm polyp in the transverse colon, removed with                        a cold biopsy forceps. Resected and retrieved.                       - Non-bleeding internal hemorrhoids.                       - Perianal skin tags found on perianal exam. Recommendation:       - Discharge patient to home.                       - Telephone GI clinic for pathology results in 1 week. Procedure Code(s):    --- Professional ---                       [phone], Colonoscopy, flexible; with biopsy, single or                        multiple Diagnosis Code(s):    --- Professional ---                       D12.3, Benign neoplasm of transverse colon (hepatic                        flexure or splenic flexure)                       K64.0, First degree hemorrhoids                       K64.4, Residual hemorrhoidal skin tags                       Z80.0, Family history of malignant neoplasm of                        digestive organs CPT copyright 2017 American [employer] Association. All rights reserved. The codes documented in this report are preliminary and upon coder review may  be revised to meet current compliance requirements. Lollie Sails, MD 08/24/2017 2:25:58 PM This report has been signed electronically. Number of Addenda: 0 Note Initiated On: 08/24/2017 1:43 PM Scope Withdrawal Time: 0 hours 16 minutes 59 seconds  Total Procedure Duration: 0 hours 27 minutes 31 seconds       [facility]

## 2017-08-24 NOTE — Transfer of Care (Signed)
Immediate Anesthesia Transfer of Care Note  Patient: Heidi Richards  Procedure(s) Performed: COLONOSCOPY WITH PROPOFOL (N/A )  Patient Location: PACU  Anesthesia Type:General  Level of Consciousness: awake and sleepy  Airway & Oxygen Therapy: Patient Spontanous Breathing and Patient connected to nasal cannula oxygen  Post-op Assessment: Report given to RN and Post -op Vital signs reviewed and stable  Post vital signs: Reviewed and stable  Last Vitals:  Vitals Value Taken Time  BP    Temp    Pulse    Resp    SpO2      Last Pain:  Vitals:   08/24/17 1319  TempSrc: Tympanic         Complications: No apparent anesthesia complications

## 2017-08-24 NOTE — Anesthesia Postprocedure Evaluation (Signed)
Anesthesia Post Note  Patient: Livingston Diones  Procedure(s) Performed: COLONOSCOPY WITH PROPOFOL (N/A )  Patient location during evaluation: Endoscopy Anesthesia Type: General Level of consciousness: awake and alert Pain management: pain level controlled Vital Signs Assessment: post-procedure vital signs reviewed and stable Respiratory status: spontaneous breathing, nonlabored ventilation, respiratory function stable and patient connected to nasal cannula oxygen Cardiovascular status: blood pressure returned to baseline and stable Postop Assessment: no apparent nausea or vomiting Anesthetic complications: no     Last Vitals:  Vitals:   08/24/17 1420 08/24/17 1430  BP: (!) 103/54 139/81  Pulse: (!) 54 (!) 53  Resp: 16 16  Temp: (!) 36 C   SpO2: 98% 98%    Last Pain:  Vitals:   08/24/17 1420  TempSrc: Tympanic                 Martha Clan

## 2017-08-24 NOTE — Anesthesia Procedure Notes (Signed)
Performed by: Cook-Martin, Jaamal Farooqui Pre-anesthesia Checklist: Patient identified, Emergency Drugs available, Suction available, Patient being monitored and Timeout performed Patient Re-evaluated:Patient Re-evaluated prior to induction Oxygen Delivery Method: Nasal cannula Preoxygenation: Pre-oxygenation with 100% oxygen Induction Type: IV induction Ventilation: Oral airway inserted - appropriate to patient size Placement Confirmation: positive ETCO2 and CO2 detector       

## 2017-08-24 NOTE — Anesthesia Preprocedure Evaluation (Signed)
Anesthesia Evaluation  Patient identified by MRN, date of birth, ID band Patient awake    Reviewed: Allergy & Precautions, H&P , NPO status , Patient's Chart, lab work & pertinent test results, reviewed documented beta blocker date and time   History of Anesthesia Complications (+) PONV and history of anesthetic complications  Airway Mallampati: II  TM Distance: <3 FB Neck ROM: limited    Dental  (+) Chipped   Pulmonary neg shortness of breath, sleep apnea , former smoker,    Pulmonary exam normal        Cardiovascular Exercise Tolerance: Good hypertension, Pt. on medications and Pt. on home beta blockers (-) angina+ Past MI  Normal cardiovascular exam+ dysrhythmias Supra Ventricular Tachycardia      Neuro/Psych negative neurological ROS  negative psych ROS   GI/Hepatic Neg liver ROS, GERD  Medicated and Controlled,  Endo/Other  diabetes, Type 2Hypothyroidism   Renal/GU negative Renal ROS  negative genitourinary   Musculoskeletal   Abdominal Normal abdominal exam  (+)   Peds negative pediatric ROS (+)  Hematology negative hematology ROS (+)   Anesthesia Other Findings Past Medical History: No date: Bladder cancer ([place]) 2006: Cancer ([place]) No date: Carbon monoxide exposure No date: History of elevated lipids No date: Hypothyroidism No date: Obesity No date: PONV (postoperative nausea and vomiting)     Comment:  nausea and vomiting No date: Pre-diabetes No date: SVT (supraventricular tachycardia) ([facility]) No date: Thyroid disease     Comment:  hypothyroid'  Past Surgical History: No date: BLADDER SURGERY 04/12/2017: EYE SURGERY     Comment:  detattached retina  BMI    Body Mass Index:  33.29 kg/m      Reproductive/Obstetrics negative OB ROS                             Anesthesia Physical  Anesthesia Plan  ASA: III  Anesthesia Plan: General   Post-op Pain Management:     Induction: Intravenous  PONV Risk Score and Plan: Propofol infusion and TIVA  Airway Management Planned: Nasal Cannula  Additional Equipment:   Intra-op Plan:   Post-operative Plan:   Informed Consent: I have reviewed the patients History and Physical, chart, labs and discussed the procedure including the risks, benefits and alternatives for the proposed anesthesia with the patient or authorized representative who has indicated his/her understanding and acceptance.   Dental advisory given  Plan Discussed with: Anesthesiologist, CRNA and Surgeon  Anesthesia Plan Comments: (Patient consented for risks of anesthesia including but not limited to:  - adverse reactions to medications - risk of intubation if required - damage to teeth, lips or other oral mucosa - sore throat or hoarseness - Damage to heart, brain, lungs or loss of life  Patient voiced understanding.)        Anesthesia Quick Evaluation

## 2017-08-24 NOTE — Anesthesia Post-op Follow-up Note (Signed)
Anesthesia QCDR form completed.        

## 2017-08-24 NOTE — H&P (Signed)
Outpatient short stay form Pre-procedure 08/24/2017 1:36 PM Lollie Sails MD  Primary Physician: Dr. Glendon Axe  Reason for visit: Colonoscopy  History of present illness: Patient is a 72 year old female presenting today as above.  She has a family history of colon cancer and multiple primary relatives.  Her last colonoscopy was 10/20/2011 showing one small polyp and a redundant colon.  The polyp was a hyperplastic polyp.  Tolerating her prep well.  She takes no aspirin or blood thinning agents.    Current Facility-Administered Medications:  .  0.9 %  sodium chloride infusion, , Intravenous, Continuous, Lollie Sails, MD  Medications Prior to Admission  Medication Sig Dispense Refill Last Dose  . levothyroxine (SYNTHROID, LEVOTHROID) 88 MCG tablet Take 88 mcg by mouth daily.   08/24/2017 at Unknown time  . metoprolol tartrate (LOPRESSOR) 25 MG tablet Take 25 mg by mouth 2 (two) times daily.   Not Taking at Unknown time  . pantoprazole (PROTONIX) 40 MG tablet Take 40 mg by mouth daily.   Not Taking at Unknown time  . simvastatin (ZOCOR) 10 MG tablet Take 10 mg by mouth at bedtime.   Not Taking at Unknown time     Allergies  Allergen Reactions  . Ciprofloxacin   . Fioricet [Butalbital-Apap-Caffeine]   . Sulfa Antibiotics      Past Medical History:  Diagnosis Date  . Bladder cancer ([place]) 2006  . Carbon monoxide exposure   . History of elevated lipids   . Hypothyroidism   . Obesity   . Personal history of chemotherapy 2006  . PONV (postoperative nausea and vomiting)    nausea and vomiting  . Pre-diabetes   . SVT (supraventricular tachycardia) ([place])   . Thyroid disease    hypothyroid'    Review of systems:      Physical Exam    Heart and lungs: Regular rate and rhythm without rub or gallop, lungs are bilaterally clear.    HEENT: Normocephalic atraumatic eyes are anicteric    Other:    Pertinant exam for procedure: Soft nontender nondistended bowel sounds  positive normoactive.    Planned proceedures: Colonoscopy and indicated procedures. I have discussed the risks benefits and complications of procedures to include not limited to bleeding, infection, perforation and the risk of sedation and the patient wishes to proceed.    Lollie Sails, MD Gastroenterology 08/24/2017  1:36 PM

## 2017-08-25 ENCOUNTER — Encounter: Payer: Self-pay | Admitting: Gastroenterology

## 2017-08-26 LAB — SURGICAL PATHOLOGY

## 2017-09-21 ENCOUNTER — Other Ambulatory Visit: Payer: Self-pay | Admitting: Ophthalmology

## 2017-09-21 DIAGNOSIS — G453 Amaurosis fugax: Secondary | ICD-10-CM

## 2017-09-29 ENCOUNTER — Ambulatory Visit
Admission: RE | Admit: 2017-09-29 | Discharge: 2017-09-29 | Disposition: A | Discharge status: Home / Self Care | Payer: Medicare Other | Source: Ambulatory Visit | Attending: Ophthalmology | Admitting: Ophthalmology

## 2017-09-29 DIAGNOSIS — G453 Amaurosis fugax: Secondary | ICD-10-CM | POA: Insufficient documentation

## 2018-08-30 IMAGING — US US CAROTID DUPLEX BILAT
1 series · 13 of 24 positions shown · non-contrast
Comparison: None.

CLINICAL DATA: Amaurosis fugax. Visual disturbance involving the
left eye lasting several minutes.

EXAM:
BILATERAL CAROTID DUPLEX ULTRASOUND
TECHNIQUE: Gray scale imaging, color Doppler and duplex ultrasound were
performed of bilateral carotid and vertebral arteries in the neck.

[Series 1: us carotid duplex bilat · 13 of 62 slices shown]
[im 1/62]
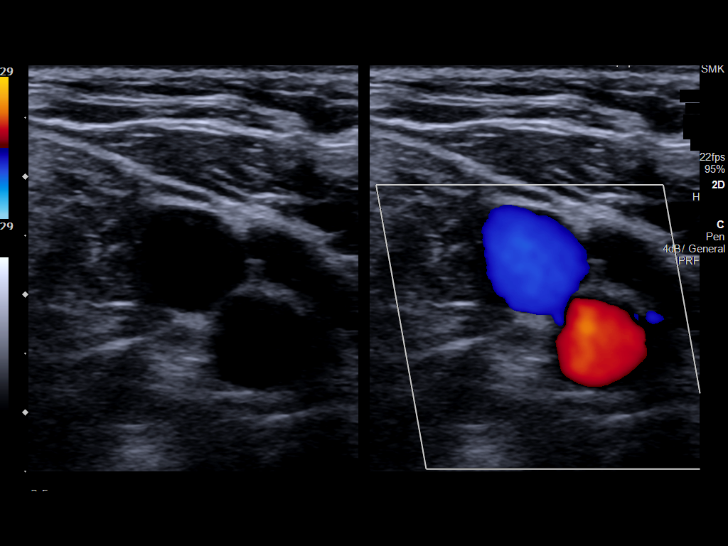
[im 6/62]
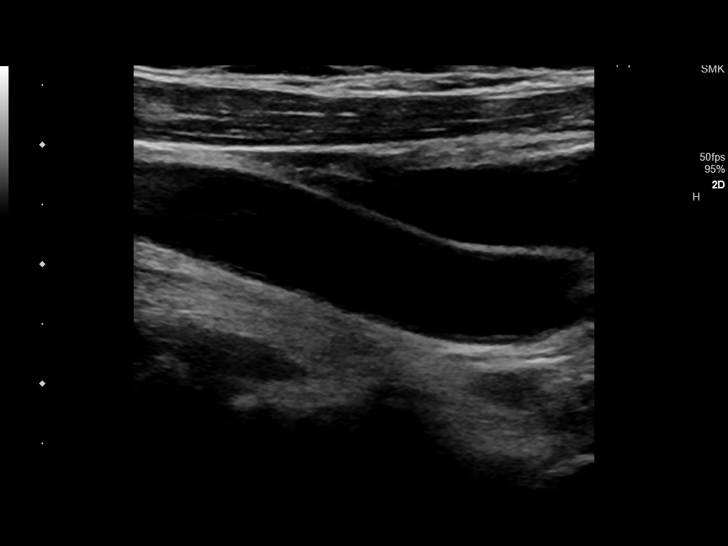
[im 11/62]
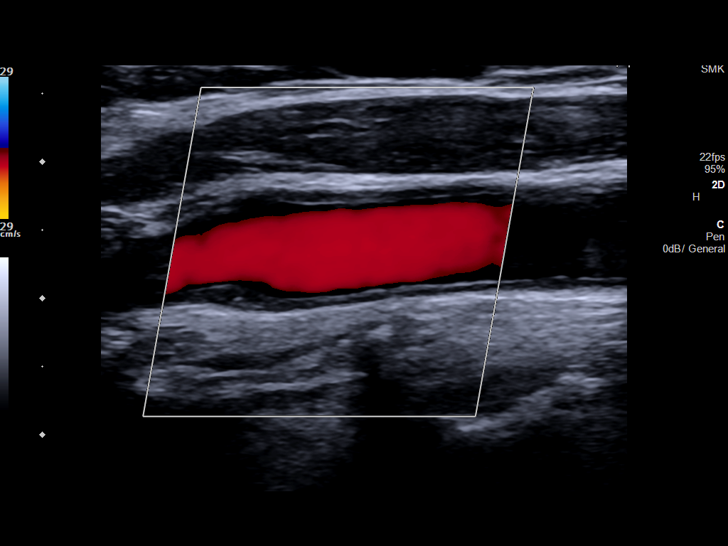
[im 16/62]
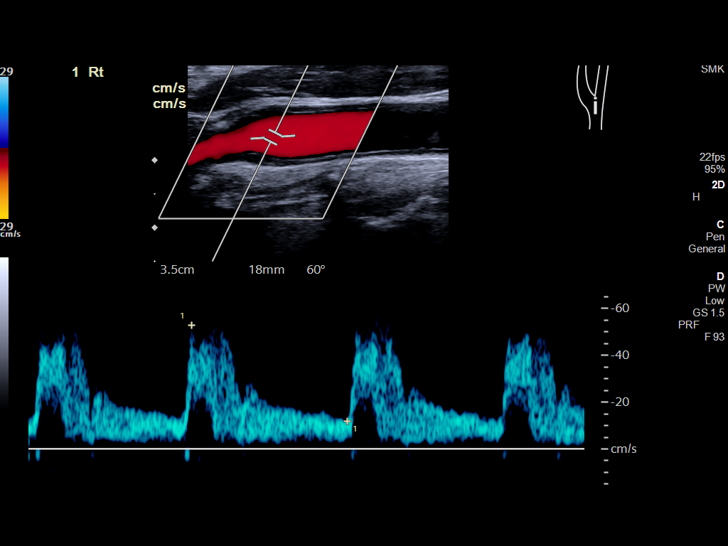
[im 22/62]
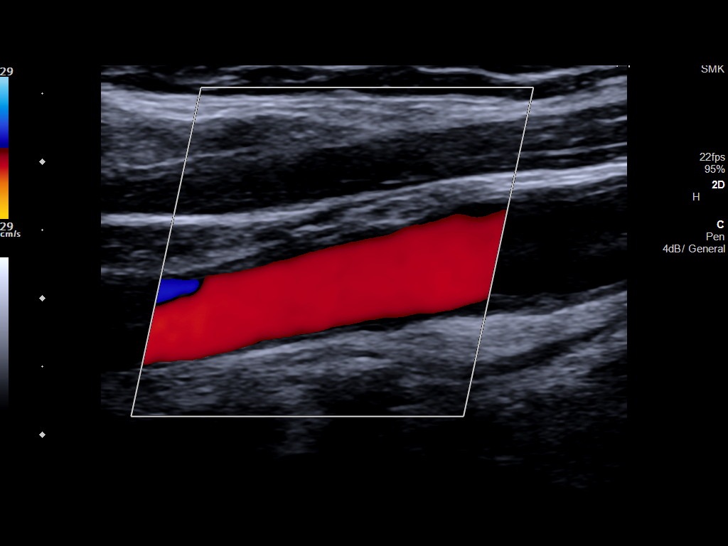
[im 27/62]
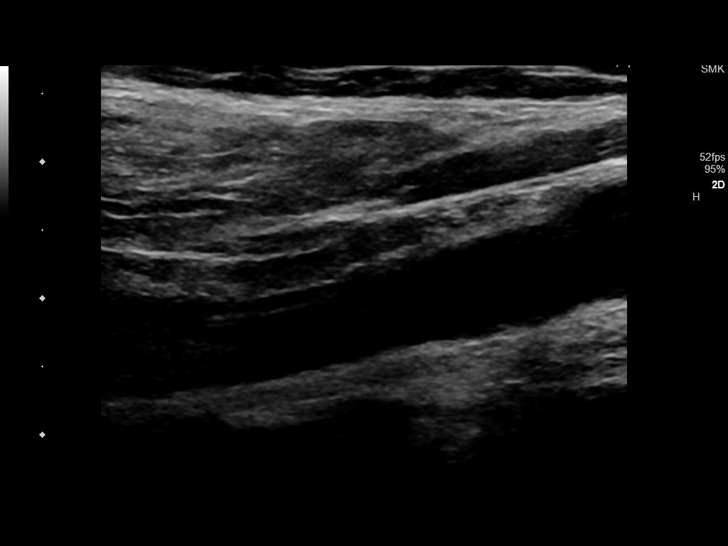
[im 32/62]
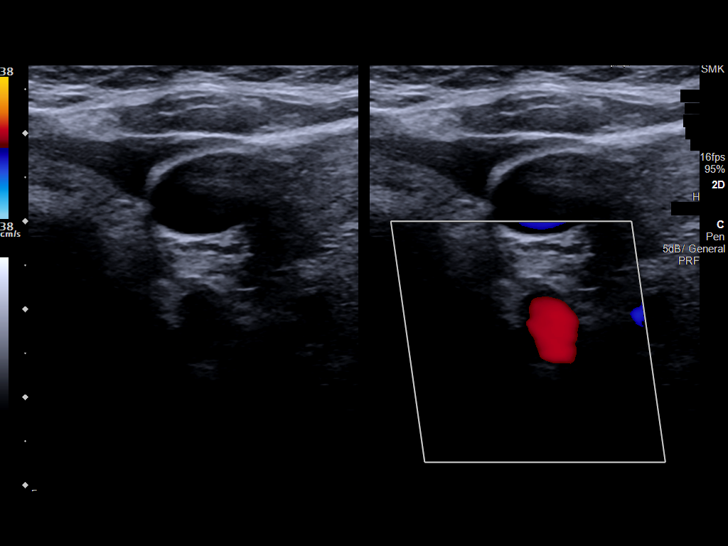
[im 35/62]
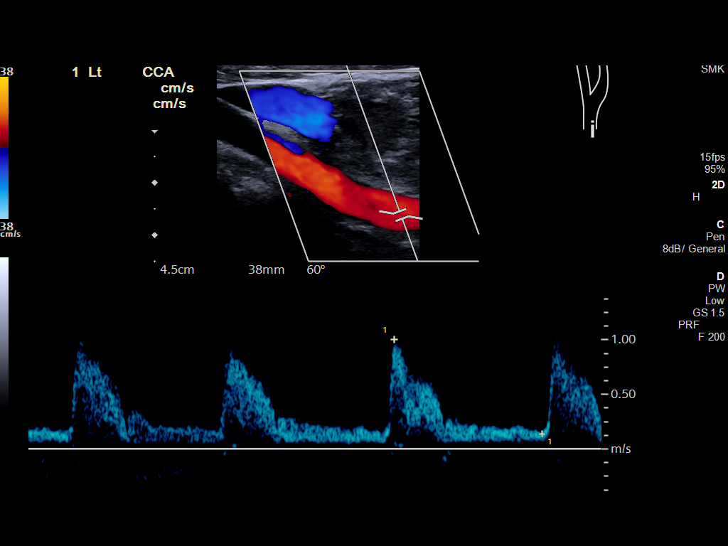
[im 40/62]
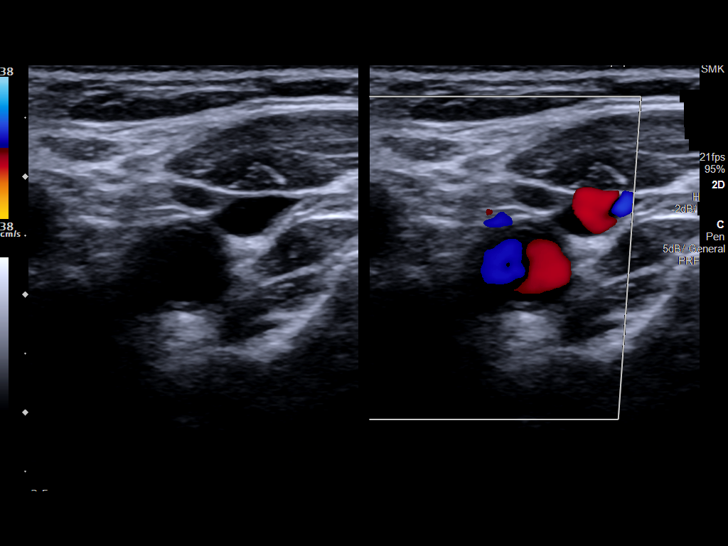
[im 46/62]
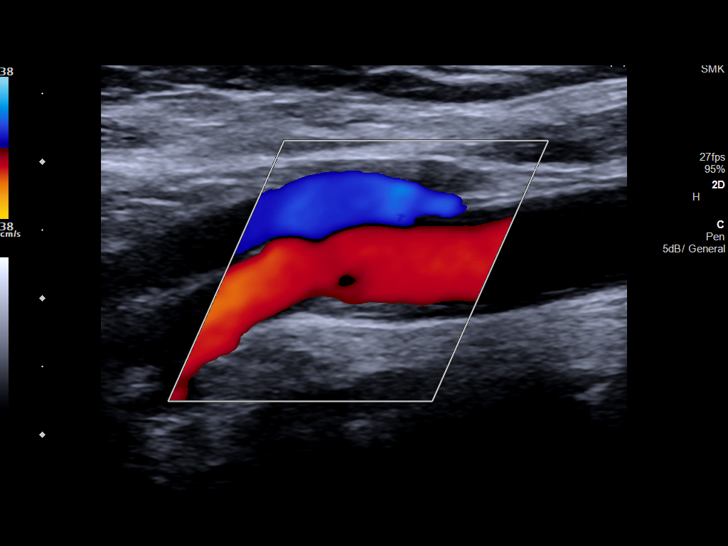
[im 51/62]
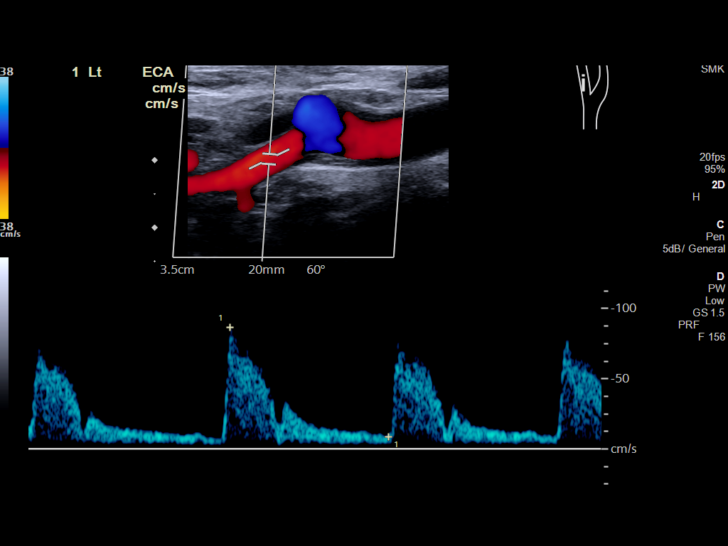
[im 56/62]
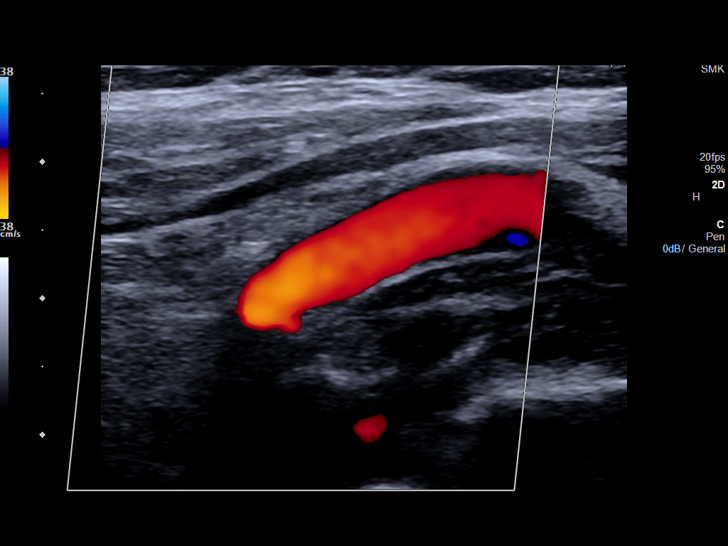
[im 62/62]
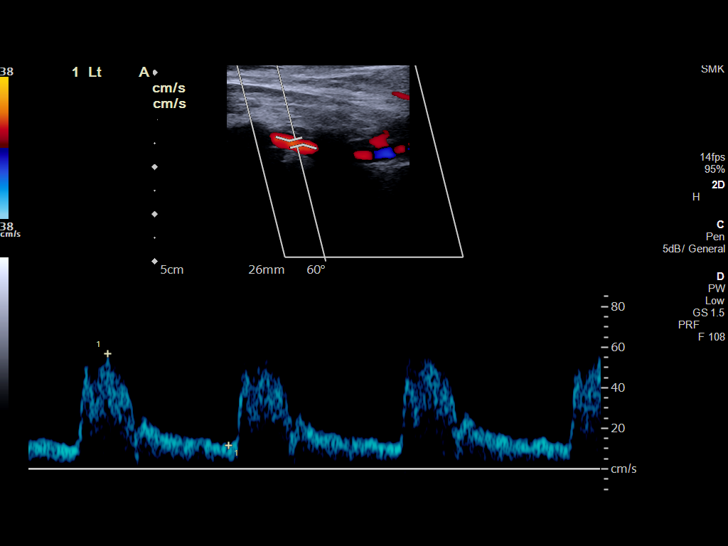

[13 of 24 positions shown; findings below may reference images not displayed]

FINDINGS: Criteria: Quantification of carotid stenosis is based on velocity
parameters that correlate the residual internal carotid diameter
with NASCET-based stenosis levels, using the diameter of the distal
internal carotid lumen as the denominator for stenosis measurement.

The following velocity measurements were obtained:

RIGHT

ICA:  61/15 cm/sec

CCA:  81/16 cm/sec

SYSTOLIC ICA/CCA RATIO:

ECA:  78 cm/sec

LEFT

ICA:  60/19 cm/sec

CCA:  84/17 cm/sec

SYSTOLIC ICA/CCA RATIO:

ECA:  87 cm/sec

RIGHT CAROTID ARTERY: There is a minimal amount of atherosclerotic
plaque within the right carotid bulb, not resulting in elevated peak
systolic velocities within the interrogated course of the right
internal carotid artery to suggest a hemodynamically significant
stenosis.

RIGHT VERTEBRAL ARTERY:  Antegrade Flow

LEFT CAROTID ARTERY: There is a minimal amount atherosclerotic
plaque within the left carotid bulb (image 46), not resulting in
elevated peak systolic velocities within the interrogated course of
the left internal carotid artery to suggest a hemodynamically
significant stenosis.

LEFT VERTEBRAL ARTERY:  Antegrade Flow
IMPRESSION: Minimal amount of bilateral atherosclerotic plaque, not resulting in
a hemodynamically significant stenosis within either internal
carotid artery.

## 2018-11-16 ENCOUNTER — Other Ambulatory Visit: Payer: Self-pay | Admitting: Internal Medicine

## 2018-11-16 DIAGNOSIS — Z1231 Encounter for screening mammogram for malignant neoplasm of breast: Secondary | ICD-10-CM

## 2018-12-23 ENCOUNTER — Ambulatory Visit
Admission: RE | Admit: 2018-12-23 | Discharge: 2018-12-23 | Disposition: A | Discharge status: Home / Self Care | Payer: Medicare Other | Source: Ambulatory Visit | Attending: Internal Medicine | Admitting: Internal Medicine

## 2018-12-23 DIAGNOSIS — Z1231 Encounter for screening mammogram for malignant neoplasm of breast: Secondary | ICD-10-CM | POA: Insufficient documentation

## 2019-10-19 IMAGING — US US PELVIS COMPLETE TRANSABD/TRANSVAG
1 series · 14 of 25 positions shown · non-contrast
Comparison: None

CLINICAL DATA: Initial evaluation for bladder carcinoma.



[Series 1: us pelvis complete transabd/transvag · 0.20mm/px · 14 of 100 slices shown]
[im 1/100]
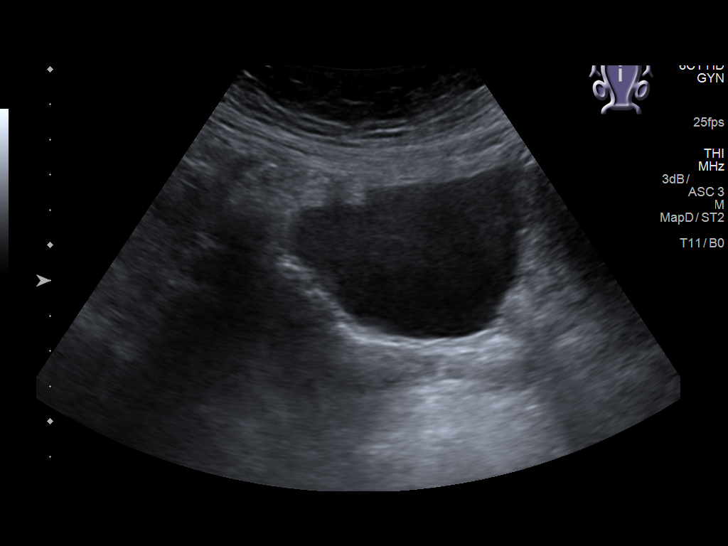
[im 9/100]
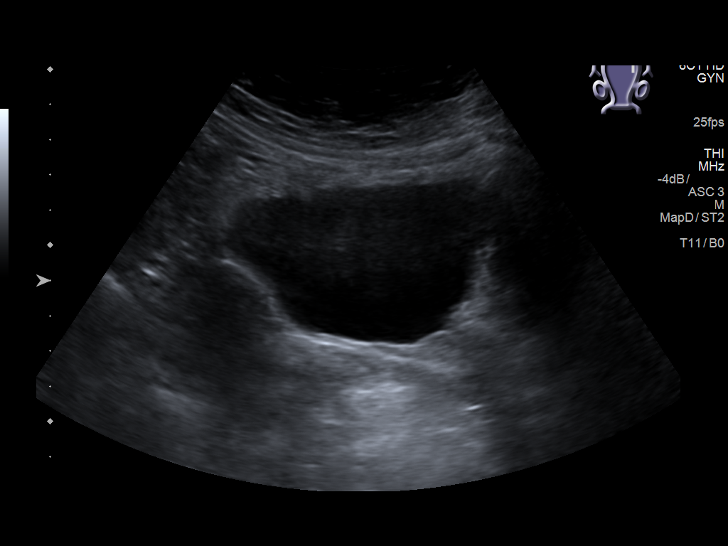
[im 17/100]
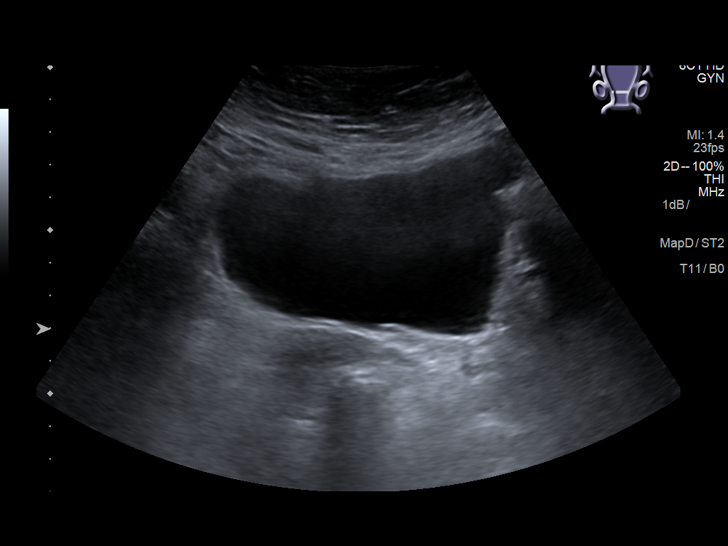
[im 25/100]
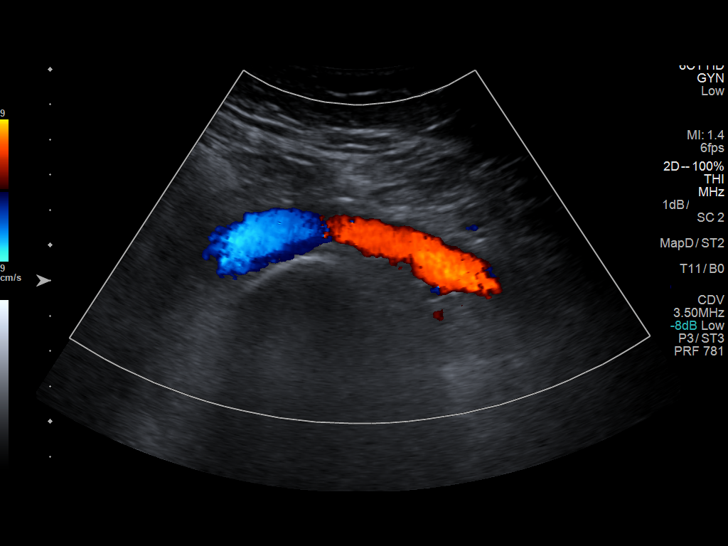
[im 34/100]
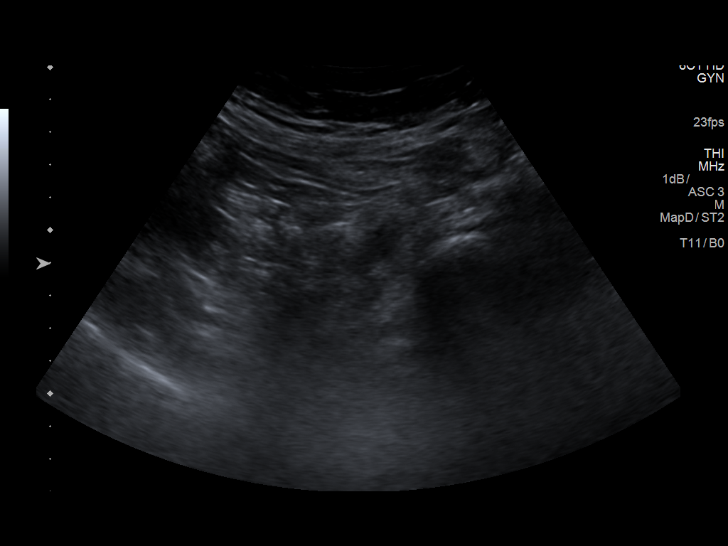
[im 38/100]
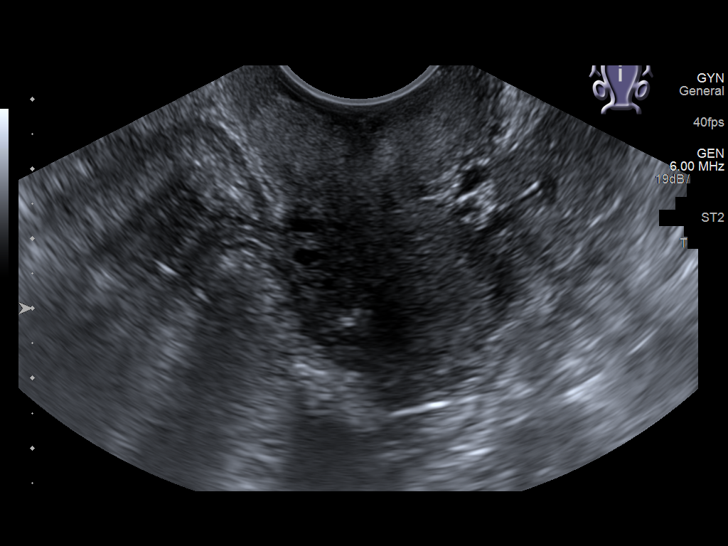
[im 46/100]
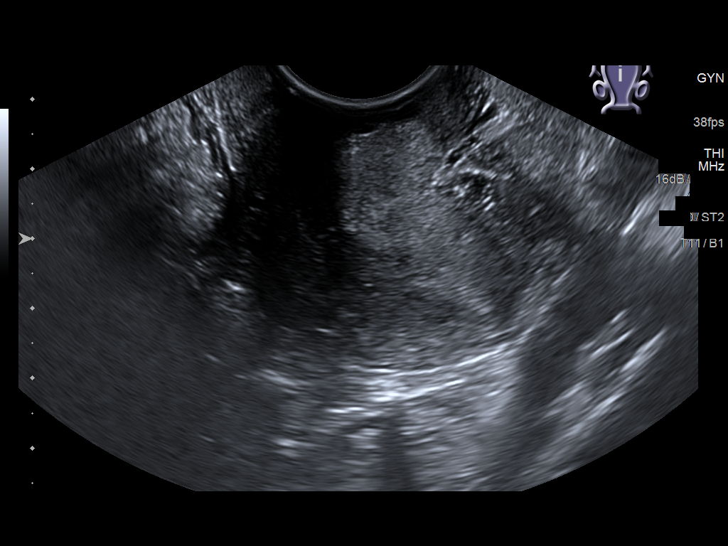
[im 54/100]
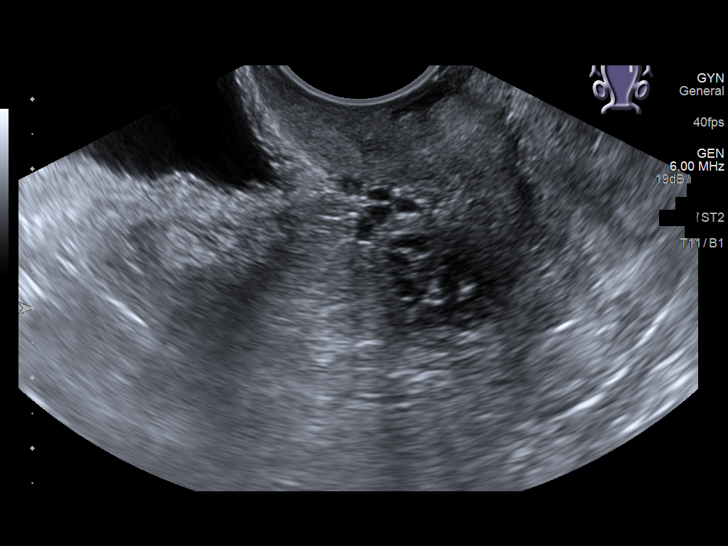
[im 62/100]
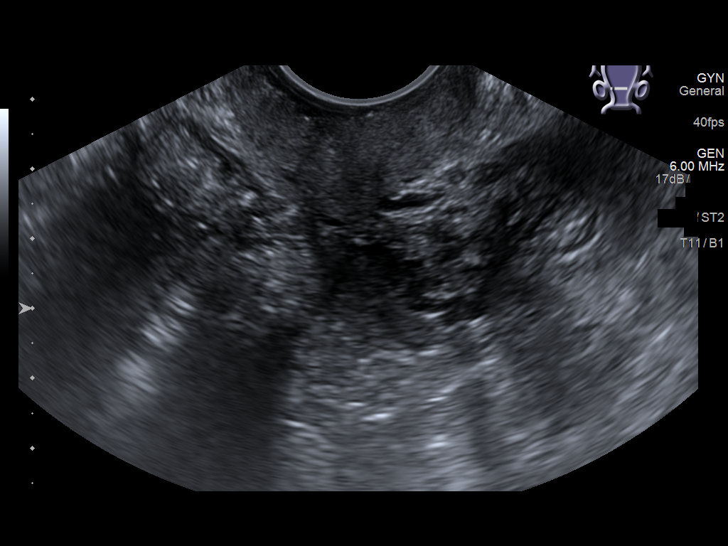
[im 67/100]
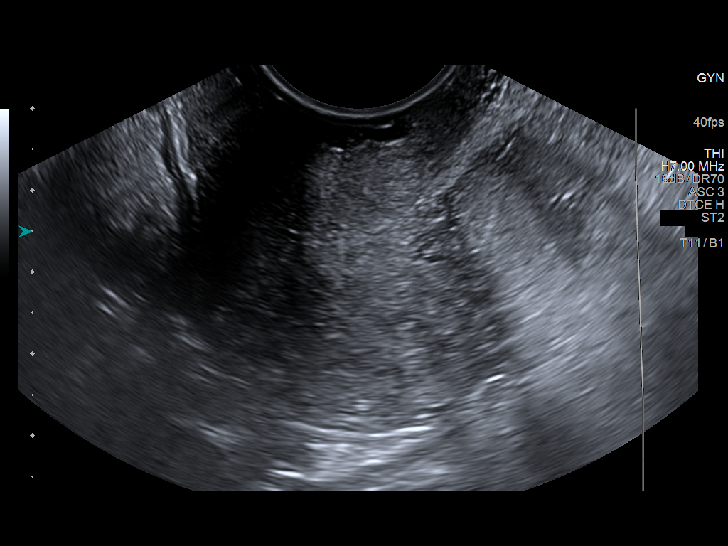
[im 75/100]
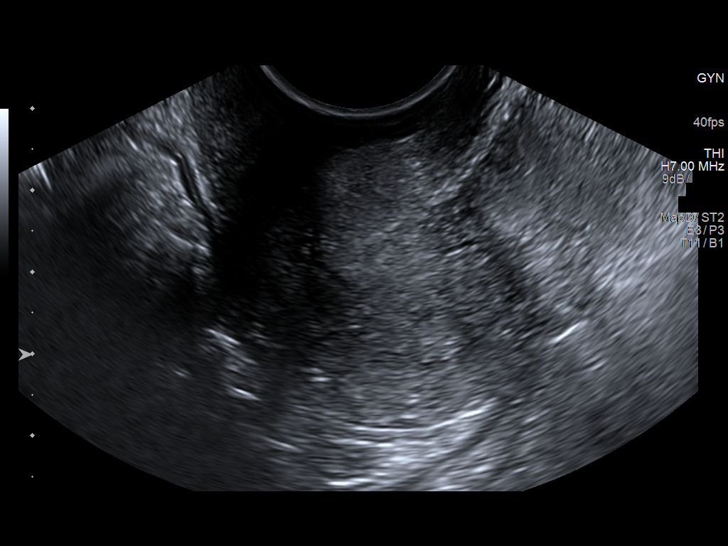
[im 83/100]
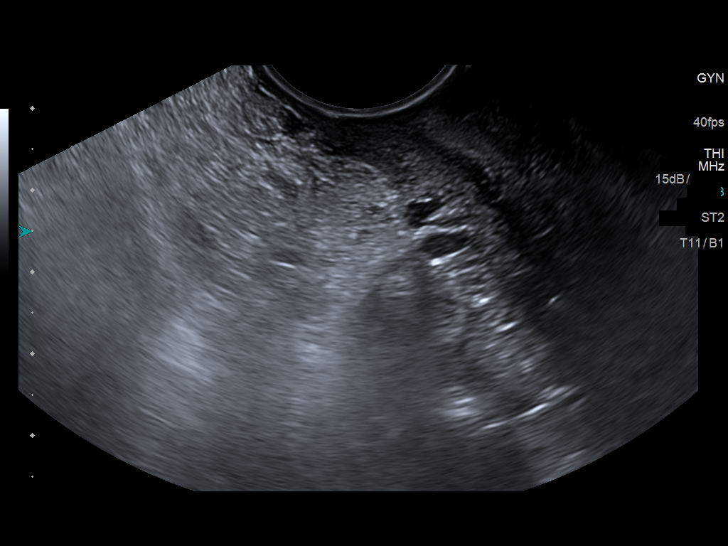
[im 91/100]
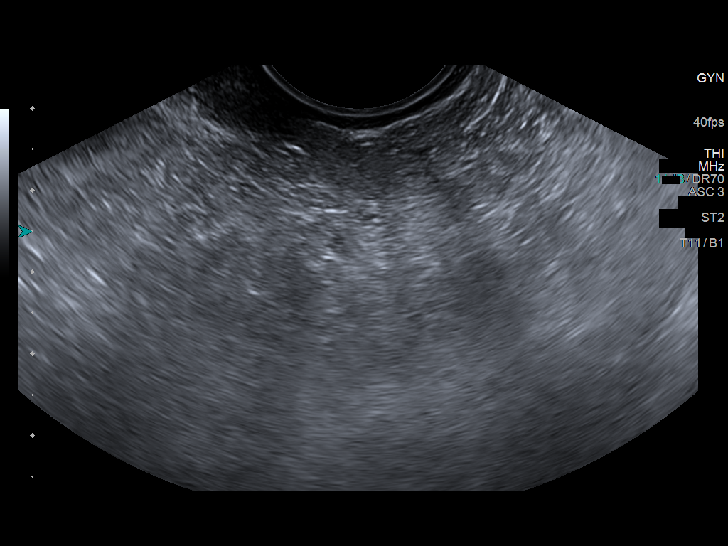
[im 100/100]
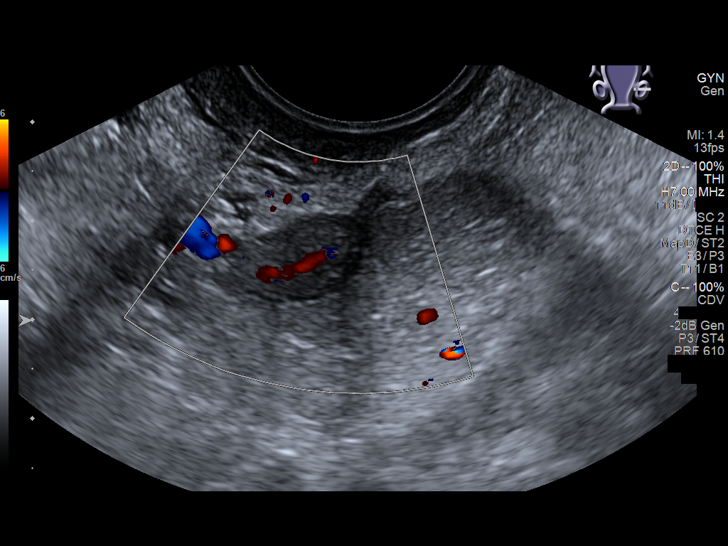

[14 of 25 positions shown; findings below may reference images not displayed]

FINDINGS: Uterus

Measurements: 5.1 x 2.5 x 3.9 cm. No fibroids or other mass
visualized.

Endometrium

Thickness: 5 mm.  No focal abnormality visualized.

Right ovary

Not visualized.  No adnexal mass.

Left ovary

Measurements: 1.3 x 0.9 x 1.0 cm. Normal appearance/no adnexal mass.

Other findings

No abnormal free fluid.
IMPRESSION: 1. No acute abnormality within the pelvis.
2. Normal sonographic appearance of the uterus and endometrium.
3. Nonvisualization of the right ovary. Normal left ovary. No pelvic
or adnexal mass.

## 2019-11-17 ENCOUNTER — Other Ambulatory Visit: Payer: Self-pay | Admitting: Internal Medicine

## 2019-11-17 DIAGNOSIS — Z1231 Encounter for screening mammogram for malignant neoplasm of breast: Secondary | ICD-10-CM

## 2019-12-25 ENCOUNTER — Ambulatory Visit
Admission: RE | Admit: 2019-12-25 | Discharge: 2019-12-25 | Disposition: A | Discharge status: Home / Self Care | Payer: Medicare Other | Source: Ambulatory Visit | Attending: Internal Medicine | Admitting: Internal Medicine

## 2019-12-25 ENCOUNTER — Other Ambulatory Visit: Payer: Self-pay

## 2019-12-25 DIAGNOSIS — Z1231 Encounter for screening mammogram for malignant neoplasm of breast: Secondary | ICD-10-CM | POA: Diagnosis not present

## 2019-12-26 ENCOUNTER — Other Ambulatory Visit: Payer: Self-pay | Admitting: Internal Medicine

## 2019-12-26 ENCOUNTER — Other Ambulatory Visit: Payer: Self-pay

## 2019-12-26 ENCOUNTER — Ambulatory Visit
Admission: RE | Admit: 2019-12-26 | Discharge: 2019-12-26 | Disposition: A | Discharge status: Home / Self Care | Payer: Medicare Other | Source: Ambulatory Visit | Attending: Internal Medicine | Admitting: Internal Medicine

## 2019-12-26 ENCOUNTER — Other Ambulatory Visit (HOSPITAL_COMMUNITY): Payer: Self-pay | Admitting: Internal Medicine

## 2019-12-26 DIAGNOSIS — M7989 Other specified soft tissue disorders: Secondary | ICD-10-CM

## 2020-06-05 ENCOUNTER — Ambulatory Visit (INDEPENDENT_AMBULATORY_CARE_PROVIDER_SITE_OTHER): Payer: Medicare Other | Admitting: Dermatology

## 2020-06-05 ENCOUNTER — Encounter: Payer: Self-pay | Admitting: Dermatology

## 2020-06-05 ENCOUNTER — Other Ambulatory Visit: Payer: Self-pay

## 2020-06-05 DIAGNOSIS — L578 Other skin changes due to chronic exposure to nonionizing radiation: Secondary | ICD-10-CM

## 2020-06-05 DIAGNOSIS — Z86018 Personal history of other benign neoplasm: Secondary | ICD-10-CM | POA: Diagnosis not present

## 2020-06-05 DIAGNOSIS — H01131 Eczematous dermatitis of right upper eyelid: Secondary | ICD-10-CM

## 2020-06-05 DIAGNOSIS — H01135 Eczematous dermatitis of left lower eyelid: Secondary | ICD-10-CM

## 2020-06-05 DIAGNOSIS — H01134 Eczematous dermatitis of left upper eyelid: Secondary | ICD-10-CM

## 2020-06-05 DIAGNOSIS — D1800 Hemangioma unspecified site: Secondary | ICD-10-CM

## 2020-06-05 DIAGNOSIS — Z1283 Encounter for screening for malignant neoplasm of skin: Secondary | ICD-10-CM | POA: Diagnosis not present

## 2020-06-05 DIAGNOSIS — H01132 Eczematous dermatitis of right lower eyelid: Secondary | ICD-10-CM | POA: Diagnosis not present

## 2020-06-05 DIAGNOSIS — L814 Other melanin hyperpigmentation: Secondary | ICD-10-CM

## 2020-06-05 DIAGNOSIS — D229 Melanocytic nevi, unspecified: Secondary | ICD-10-CM

## 2020-06-05 DIAGNOSIS — L719 Rosacea, unspecified: Secondary | ICD-10-CM

## 2020-06-05 DIAGNOSIS — I781 Nevus, non-neoplastic: Secondary | ICD-10-CM

## 2020-06-05 DIAGNOSIS — L300 Nummular dermatitis: Secondary | ICD-10-CM

## 2020-06-05 DIAGNOSIS — L821 Other seborrheic keratosis: Secondary | ICD-10-CM

## 2020-06-05 MED ORDER — EUCRISA 2 % EX OINT: 1.0000 "application " | TOPICAL_OINTMENT | CUTANEOUS | 3 refills | Status: DC

## 2020-06-05 MED ORDER — RHOFADE 1 % EX CREA: 1.0000 "application " | TOPICAL_CREAM | Freq: Every morning | CUTANEOUS | 4 refills | Status: DC

## 2020-06-05 NOTE — Progress Notes (Signed)
Follow-Up Visit   Subjective  Heidi Richards is a 75 y.o. female who presents for the following: Total body skin exam (Hx of Dysplastic nevus R post shoulder), hx of nummular derm (Body, mometasone cr prn), and hx of eyelid derm (Protopic 0.1% oint prn). The patient has a long history of itchy skin especially of the upper back. The patient presents for Total-Body Skin Exam (TBSE) for skin cancer screening and mole check.  The following portions of the chart were reviewed this encounter and updated as appropriate:   Tobacco  Allergies  Meds  Problems  Med Hx  Surg Hx  Fam Hx     Review of Systems:  No other skin or systemic complaints except as noted in HPI or Assessment and Plan.  Objective  Well appearing patient in no apparent distress; mood and affect are within normal limits.  A full examination was performed including scalp, head, eyes, ears, nose, lips, neck, chest, axillae, abdomen, back, buttocks, bilateral upper extremities, bilateral lower extremities, hands, feet, fingers, toes, fingernails, and toenails. All findings within normal limits unless otherwise noted below.  Objective  Right post shoulder: Scar with no evidence of recurrence.   Objective  bil eyelids: Erythema glabella  Objective  upper back, legs: Pink patch upper back  Objective  face: Erythema face  Objective  bil legs: Telangiectasias bil legs   Assessment & Plan   Lentigines - Scattered tan macules - Due to sun exposure - Benign-appering, observe - Recommend daily broad spectrum sunscreen SPF 30+ to sun-exposed areas, reapply every 2 hours as needed. - Call for any changes  Seborrheic Keratoses - Stuck-on, waxy, tan-brown papules and plaques  - Discussed benign etiology and prognosis. - Observe - Call for any changes  Melanocytic Nevi - Tan-brown and/or pink-flesh-colored symmetric macules and papules - Benign appearing on exam today - Observation - Call clinic for new or  changing moles - Recommend daily use of broad spectrum spf 30+ sunscreen to sun-exposed areas.   Hemangiomas - Red papules - Discussed benign nature - Observe - Call for any changes  Actinic Damage - Chronic, secondary to cumulative UV/sun exposure - diffuse scaly erythematous macules with underlying dyspigmentation - Recommend daily broad spectrum sunscreen SPF 30+ to sun-exposed areas, reapply every 2 hours as needed.  - Call for new or changing lesions.  Skin cancer screening performed today.  History of dysplastic nevus Right post shoulder  Clear. Observe for recurrence. Call clinic for new or changing lesions.  Recommend regular skin exams, daily broad-spectrum spf 30+ sunscreen use, and photoprotection.     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, persistent Start Eucrisa oint qd/bid Cont Mometasone cr alternating with Tacrolimus qd aa face/body (using Mometasone cr one day then Tacrolumus the next day)  Nummular dermatitis upper back, legs With Pruritis/Atopic derm/neruodermatitis Chronic, persistent Start Eucrisa oint qd/bid Cont Mometasone cr alternating with Tacrolimus qd aa face/body (using Mometasone cr one day then Tacrolumus the next day)  If Eucrisa not covered will send in Skin Medicinals Amitriptyline 5%, Gabapentin 10%, Lidocaine 5% Cream for itch  Atopic dermatitis (eczema) is a chronic, relapsing, pruritic condition that can significantly affect quality of life. It is often associated with allergic rhinitis and/or asthma and can require treatment with topical medications, phototherapy, or in severe cases a biologic medication called Dupixent in older children and adults.   Crisaborole (EUCRISA) 2 % OINT - upper back, legs  Rosacea -erythritol angiectatic type. face  Discussed BBL treatment Discussed Rhofade cream vs skin medicinals Triple cream  Start Rhofade qam to face  Rosacea is a chronic progressive skin  condition usually affecting the face of adults, causing redness and/or acne bumps. It is treatable but not curable. It sometimes affects the eyes (ocular rosacea) as well. It may respond to topical and/or systemic medication and can flare with stress, sun exposure, alcohol, exercise and some foods.  Daily application of broad spectrum spf 30+ sunscreen to face is recommended to reduce flares.  Oxymetazoline HCl (RHOFADE) 1 % CREA - face  Spider veins bil legs Benign, observe  Return in about 2 months (around 08/03/2020) for Nummular derm/eyelid derm.  I, Othelia Pulling, RMA, am acting as scribe for Sarina Ser, MD .  Documentation: I have reviewed the above documentation for accuracy and completeness, and I agree with the above.  Sarina Ser, MD

## 2020-06-05 NOTE — Patient Instructions (Signed)
Rhofade cream Apply to face in the morning for redness/Rosacea  Nummular dermatitis/eyelid dermatitis Eucrisa ointment 1-2 times a day to itchy rash on face, back, hands and legs as needed for itching  Mometasone cream and Tacrolimus 0.1% oint alternate these creams using one a day to itchy areas on face, hands, back and legs

## 2020-06-08 ENCOUNTER — Encounter: Payer: Self-pay | Admitting: Dermatology

## 2020-06-10 ENCOUNTER — Telehealth: Payer: Self-pay

## 2020-06-10 MED ORDER — MIRVASO 0.33 % EX GEL: CUTANEOUS | 4 refills | Status: DC

## 2020-06-10 NOTE — Telephone Encounter (Signed)
Pt left vm stating that the Eucrisa is going to be $150 out of pocket but that bceause she liked the samples and feels it works so she is willing to pay that. But the Rhofade is $500+ out of pocket and that is too much for her. She is wondering about a replacement for the Rhofade. She did like it and felt that it was helping from the samples that she had.

## 2020-06-10 NOTE — Addendum Note (Signed)
Addended by: Harriett Sine on: 06/10/2020 04:40 PM   Modules accepted: Orders

## 2020-06-10 NOTE — Telephone Encounter (Signed)
Felecia Shelling is  the only other thing like Rhofade. May send Mirvaso to see cost.

## 2020-06-10 NOTE — Telephone Encounter (Signed)
Mirvaso sent to pharmacy. Pt informed.   Pt asked if there was an alternative to the [place] that may be cheaper. If not she will pay out of pocket.

## 2020-06-20 ENCOUNTER — Other Ambulatory Visit: Payer: Self-pay | Admitting: Physician Assistant

## 2020-06-20 DIAGNOSIS — R058 Other specified cough: Secondary | ICD-10-CM

## 2020-06-20 DIAGNOSIS — R54 Age-related physical debility: Secondary | ICD-10-CM

## 2020-06-20 DIAGNOSIS — Z20822 Contact with and (suspected) exposure to covid-19: Secondary | ICD-10-CM

## 2020-06-20 NOTE — Progress Notes (Signed)
I connected by phone with Heidi Richards on 06/20/2020 at 9:40 AM to discuss the potential use of a new treatment for mild to moderate COVID-19 viral infection in non-hospitalized patients.  This patient is a 75 y.o. female that meets the FDA criteria for Emergency Use Authorization of COVID monoclonal antibody sotrovimab.   Has a (+) direct SARS-CoV-2 viral test result  Has mild or moderate COVID-19   Is NOT hospitalized due to COVID-19  Is within 10 days of symptom onset  Has at least one of the high risk factor(s) for progression to severe COVID-19 and/or hospitalization as defined in EUA.  Specific high risk criteria : Older age (>/= 77 yo), BMI > 25 and Cardiovascular disease or hypertension   I have spoken and communicated the following to the patient or parent/caregiver regarding COVID monoclonal antibody treatment:  1. FDA has authorized the emergency use for the treatment of mild to moderate COVID-19 in adults and pediatric patients with positive results of direct SARS-CoV-2 viral testing who are 67 years of age and older weighing at least 40 kg, and who are at high risk for progressing to severe COVID-19 and/or hospitalization.  2. The significant known and potential risks and benefits of COVID monoclonal antibody, and the extent to which such potential risks and benefits are unknown.  3. Information on available alternative treatments and the risks and benefits of those alternatives, including clinical trials.  4. Patients treated with COVID monoclonal antibody should continue to self-isolate and use infection control measures (e.g., wear mask, isolate, social distance, avoid sharing personal items, clean and disinfect "high touch" surfaces, and frequent handwashing) according to CDC guidelines.   5. The patient or parent/caregiver has the option to accept or refuse COVID monoclonal antibody treatment.  After reviewing this information with the patient, the patient has agreed  to receive one of the available covid 19 monoclonal antibodies and will be provided an appropriate fact sheet prior to infusion.  Sx onset 3/9. Set up for infusion on 3/11 @ 11:30am. Directions given to [facility]. Pt is aware that insurance will be charged an infusion fee.   [name] 06/20/2020 9:40 AM

## 2020-06-21 ENCOUNTER — Ambulatory Visit (HOSPITAL_COMMUNITY)
Admission: RE | Admit: 2020-06-21 | Discharge: 2020-06-21 | Disposition: A | Discharge status: Home / Self Care | Payer: Medicare Other | Source: Ambulatory Visit | Attending: Pulmonary Disease | Admitting: Pulmonary Disease

## 2020-06-21 DIAGNOSIS — R058 Other specified cough: Secondary | ICD-10-CM

## 2020-06-21 DIAGNOSIS — R54 Age-related physical debility: Secondary | ICD-10-CM | POA: Insufficient documentation

## 2020-06-21 DIAGNOSIS — I10 Essential (primary) hypertension: Secondary | ICD-10-CM | POA: Insufficient documentation

## 2020-06-21 DIAGNOSIS — U071 COVID-19: Secondary | ICD-10-CM | POA: Diagnosis present

## 2020-06-21 MED ORDER — EPINEPHRINE 0.3 MG/0.3ML IJ SOAJ: 0.3000 mg | Freq: Once | INTRAMUSCULAR | Status: DC | PRN

## 2020-06-21 MED ORDER — METHYLPREDNISOLONE SODIUM SUCC 125 MG IJ SOLR: 125.0000 mg | Freq: Once | INTRAMUSCULAR | Status: DC | PRN

## 2020-06-21 MED ORDER — ALBUTEROL SULFATE HFA 108 (90 BASE) MCG/ACT IN AERS: 2.0000 | INHALATION_SPRAY | Freq: Once | RESPIRATORY_TRACT | Status: DC | PRN

## 2020-06-21 MED ORDER — SOTROVIMAB 500 MG/8ML IV SOLN
500.0000 mg | Freq: Once | INTRAVENOUS | Status: AC
  Administered 2020-06-21: 500 mg via INTRAVENOUS

## 2020-06-21 MED ORDER — FAMOTIDINE IN NACL 20-0.9 MG/50ML-% IV SOLN: 20.0000 mg | Freq: Once | INTRAVENOUS | Status: DC | PRN

## 2020-06-21 MED ORDER — SODIUM CHLORIDE 0.9 % IV SOLN: INTRAVENOUS | Status: DC | PRN

## 2020-06-21 MED ORDER — DIPHENHYDRAMINE HCL 50 MG/ML IJ SOLN: 50.0000 mg | Freq: Once | INTRAMUSCULAR | Status: DC | PRN

## 2020-06-21 NOTE — Progress Notes (Addendum)
Diagnosis: COVID-19  Physician: Dr. Asencion Noble  Procedure: Covid Infusion Clinic Med: Sotrovimab infusion - Provided patient with sotrovimab fact sheet for patients, parents, and caregivers prior to infusion.   Complications: HTN, pt's BP post infusion 192/98. Pt states she has white coat syndrome and was told by her PCP that she is pre-hypertensive. RN called Gardenia Phlegm, NP. Advised to have pt check BP at home and follow up with her PCP. Pt understands without assistance.   Discharge: Discharged home

## 2020-06-21 NOTE — Progress Notes (Signed)
Patient reviewed Fact Sheet for Patients, Parents, and Caregivers for Emergency Use Authorization (EUA) of sotrovimab for the Treatment of Coronavirus. Patient also reviewed and is agreeable to the estimated cost of treatment. Patient is agreeable to proceed.   

## 2020-06-21 NOTE — Discharge Instructions (Signed)

## 2020-07-25 ENCOUNTER — Ambulatory Visit (INDEPENDENT_AMBULATORY_CARE_PROVIDER_SITE_OTHER): Payer: Medicare Other | Admitting: Dermatology

## 2020-07-25 ENCOUNTER — Other Ambulatory Visit: Payer: Self-pay

## 2020-07-25 DIAGNOSIS — H01134 Eczematous dermatitis of left upper eyelid: Secondary | ICD-10-CM

## 2020-07-25 DIAGNOSIS — L719 Rosacea, unspecified: Secondary | ICD-10-CM

## 2020-07-25 DIAGNOSIS — H01131 Eczematous dermatitis of right upper eyelid: Secondary | ICD-10-CM

## 2020-07-25 DIAGNOSIS — L820 Inflamed seborrheic keratosis: Secondary | ICD-10-CM

## 2020-07-25 MED ORDER — RHOFADE 1 % EX CREA: 1.0000 "application " | TOPICAL_CREAM | Freq: Every morning | CUTANEOUS | 4 refills | Status: DC

## 2020-07-25 NOTE — Progress Notes (Signed)
   Follow-Up Visit   Subjective  CERENITI CURB is a 75 y.o. female who presents for the following: Eczema (Bil eyelids, Eucrisa oint), Rosacea (Face, rhofade not covered, pt tried cerave cream/antihistamine didn't help), and check spot (Chest, ~66m, irritated, pt picks at).  The following portions of the chart were reviewed this encounter and updated as appropriate:   Tobacco  Allergies  Meds  Problems  Med Hx  Surg Hx  Fam Hx     Review of Systems:  No other skin or systemic complaints except as noted in HPI or Assessment and Plan.  Objective  Well appearing patient in no apparent distress; mood and affect are within normal limits.  A focused examination was performed including face, chest. Relevant physical exam findings are noted in the Assessment and Plan.  Objective  Right Eye: Upper eyelids with mild edema  Objective  R chest x 1: Erythematous keratotic or waxy stuck-on papule or plaque.   Objective  Head - Anterior (Face): Erythema and telangiectasias face   Assessment & Plan  Eczematous dermatitis of upper eyelids of both eyes Right Eye Vs ACD/Irritant Dermatitis vs other Chronic; recurrent. Discussed TRUE 36 Patch testing  Cont Eucrisa oint qd/bid eyelids prn flares  Discussed changing to Opzelura Discussed Rinvoq, Adbry, Dupixent  Inflamed seborrheic keratosis R chest x 1 Destruction of lesion - R chest x 1 Complexity: simple   Destruction method: cryotherapy   Informed consent: discussed and consent obtained   Timeout:  patient name, date of birth, surgical site, and procedure verified Lesion destroyed using liquid nitrogen: Yes   Region frozen until ice ball extended beyond lesion: Yes   Outcome: patient tolerated procedure well with no complications   Post-procedure details: wound care instructions given    Rosacea Head - Anterior (Face) Rosacea is a chronic progressive skin condition usually affecting the face of adults, causing redness  and/or acne bumps. It is treatable but not curable. It sometimes affects the eyes (ocular rosacea) as well. It may respond to topical and/or systemic medication and can flare with stress, sun exposure, alcohol, exercise and some foods.  Daily application of broad spectrum spf 30+ sunscreen to face is recommended to reduce flares.  Cont Rhofade qd, samples given x 4 Lot [facility] 07/22, script sent to [facility]  Oxymetazoline HCl (RHOFADE) 1 % CREA - Head - Anterior (Face)  Other Related Medications Oxymetazoline HCl (RHOFADE) 1 % CREA  Return in about 1 year (around 07/25/2021) for TBSE.   I, Othelia Pulling, RMA, am acting as scribe for Sarina Ser, MD .  Documentation: I have reviewed the above documentation for accuracy and completeness, and I agree with the above.  Sarina Ser, MD

## 2020-07-25 NOTE — Patient Instructions (Signed)

## 2020-07-31 ENCOUNTER — Encounter: Payer: Self-pay | Admitting: Dermatology

## 2020-08-29 ENCOUNTER — Ambulatory Visit (INDEPENDENT_AMBULATORY_CARE_PROVIDER_SITE_OTHER): Payer: Medicare Other | Admitting: Dermatology

## 2020-08-29 ENCOUNTER — Other Ambulatory Visit: Payer: Self-pay

## 2020-08-29 DIAGNOSIS — L309 Dermatitis, unspecified: Secondary | ICD-10-CM | POA: Diagnosis not present

## 2020-08-29 MED ORDER — MOMETASONE FUROATE 0.1 % EX OINT: TOPICAL_OINTMENT | Freq: Every day | CUTANEOUS | 0 refills | Status: DC

## 2020-08-29 NOTE — Progress Notes (Signed)
   Follow-Up Visit   Subjective  Heidi Richards is a 75 y.o. female who presents for the following: Rash (Stated last week - redness, peeling, burning, rough feeling, swollen of the lower face. Using Eucrisa 2% ointment to aa's QD patient can't tell if it has helped or not).  The following portions of the chart were reviewed this encounter and updated as appropriate:   Tobacco  Allergies  Meds  Problems  Med Hx  Surg Hx  Fam Hx     Review of Systems:  No other skin or systemic complaints except as noted in HPI or Assessment and Plan.  Objective  Well appearing patient in no apparent distress; mood and affect are within normal limits.  A focused examination was performed including the face. Relevant physical exam findings are noted in the Assessment and Plan.   Assessment & Plan  Dermatitis Face Possible allergic to mask vs reaction to Rhofade -   Recommend switching to cloth mask.  Discussed patch testing.  Advised patient to apply Rhofade to the neck or inner arm QD x 1 week to see if reaction occurs.   Start Mometasone 0.1% ointment apply to aa's BID x 2 weeks.   Topical steroids (such as triamcinolone, fluocinolone, fluocinonide, mometasone, clobetasol, halobetasol, betamethasone, hydrocortisone) can cause thinning and lightening of the skin if they are used for too long in the same area. Your physician has selected the right strength medicine for your problem and area affected on the body. Please use your medication only as directed by your physician to prevent side effects.   mometasone (ELOCON) 0.1 % ointment - Face  Return for appointment as scheduled.  Luther Redo, CMA, am acting as scribe for Sarina Ser, MD .  Documentation: I have reviewed the above documentation for accuracy and completeness, and I agree with the above.  Sarina Ser, MD

## 2020-08-29 NOTE — Patient Instructions (Signed)

## 2020-09-06 ENCOUNTER — Encounter: Payer: Self-pay | Admitting: Dermatology

## 2020-12-03 ENCOUNTER — Other Ambulatory Visit: Payer: Self-pay | Admitting: Internal Medicine

## 2020-12-03 DIAGNOSIS — Z1231 Encounter for screening mammogram for malignant neoplasm of breast: Secondary | ICD-10-CM

## 2020-12-30 ENCOUNTER — Other Ambulatory Visit: Payer: Self-pay

## 2020-12-30 ENCOUNTER — Ambulatory Visit
Admission: RE | Admit: 2020-12-30 | Discharge: 2020-12-30 | Disposition: A | Discharge status: Home / Self Care | Payer: Medicare Other | Source: Ambulatory Visit | Attending: Internal Medicine | Admitting: Internal Medicine

## 2020-12-30 DIAGNOSIS — Z1231 Encounter for screening mammogram for malignant neoplasm of breast: Secondary | ICD-10-CM | POA: Insufficient documentation

## 2021-01-10 ENCOUNTER — Ambulatory Visit (INDEPENDENT_AMBULATORY_CARE_PROVIDER_SITE_OTHER): Payer: Medicare Other

## 2021-01-10 ENCOUNTER — Encounter: Payer: Self-pay | Admitting: Cardiovascular Disease

## 2021-01-10 ENCOUNTER — Other Ambulatory Visit: Payer: Self-pay

## 2021-01-10 ENCOUNTER — Ambulatory Visit (INDEPENDENT_AMBULATORY_CARE_PROVIDER_SITE_OTHER): Payer: Medicare Other | Admitting: Cardiovascular Disease

## 2021-01-10 VITALS — BP 158/70 | HR 67 | Ht 63.5 in | Wt 199.2 lb

## 2021-01-10 DIAGNOSIS — I498 Other specified cardiac arrhythmias: Secondary | ICD-10-CM | POA: Diagnosis not present

## 2021-01-10 DIAGNOSIS — Z20822 Contact with and (suspected) exposure to covid-19: Secondary | ICD-10-CM

## 2021-01-10 DIAGNOSIS — I10 Essential (primary) hypertension: Secondary | ICD-10-CM | POA: Diagnosis not present

## 2021-01-10 DIAGNOSIS — R058 Other specified cough: Secondary | ICD-10-CM

## 2021-01-10 DIAGNOSIS — R001 Bradycardia, unspecified: Secondary | ICD-10-CM

## 2021-01-10 MED ORDER — LOSARTAN POTASSIUM-HCTZ 50-12.5 MG PO TABS: 1.5000 | ORAL_TABLET | Freq: Every day | ORAL | 6 refills | Status: DC

## 2021-01-10 NOTE — Progress Notes (Signed)
Cardiology Office Note  Date:  01/10/2021   ID:  KILEY SOLIMINE, DOB Apr 08, 1946, MRN [id number]  PCP:  Tracie Harrier, MD   Chief Complaint  Patient presents with   New Patient (Initial Visit)    Ref by Dr. Ginette Pitman for bradycardia. Medications reviewed by the patient verbally. Patient c/o decreased heart rate with an elevated blood pressure.     HPI:  Ms. Heidi Richards is a 75 year old woman with past medical history of Hypothyroidism Essential hypertension Diabetes type 2 Who presents by referral from Dr. Ginette Pitman for consultation of her bradycardia, hypertension  Reports that she has a watch that monitors heart rate Has had episodes where she sits down at nighttime to relax, watch will alarm her that heart rate is low in the 30s Has had multiple episodes, last was approximately 1 week ago Asymptomatic during these episodes  Covid 06/2020 treated with infusion Since that time she reports having Elevated blood pressure Started losartan:  BP still elevated on losartan HCT Started on amlodipine 2.5 daily  At home 140s/70-80 Numbers reviewed with her today rare 1 20-1 30, typically 1 78-2 50 systolic  Active at baseline  EKG personally reviewed by myself on todays visit Normal sinus rhythm rate 67 bpm sinus arrhythmia    PMH:   has a past medical history of Bladder cancer ([place]) (2006), Carbon monoxide exposure, Dysplastic nevus (09/16/2009), History of elevated lipids, Hypothyroidism, Obesity, Personal history of chemotherapy (2006), PONV (postoperative nausea and vomiting), Pre-diabetes, SVT (supraventricular tachycardia) ([place]), and Thyroid disease.  PSH:    Past Surgical History:  Procedure Laterality Date   BLADDER SURGERY     COLONOSCOPY WITH PROPOFOL N/A 06/21/2017   Procedure: COLONOSCOPY WITH PROPOFOL;  Surgeon: Lollie Sails, MD;  Location: [facility] ENDOSCOPY;  Service: Endoscopy;  Laterality: N/A;   COLONOSCOPY WITH PROPOFOL N/A 08/24/2017   Procedure: COLONOSCOPY  WITH PROPOFOL;  Surgeon: Lollie Sails, MD;  Location[facility] ENDOSCOPY;  Service: Endoscopy;  Laterality: N/A;   EYE SURGERY  04/12/2017   detattached retina    Current Outpatient Medications  Medication Sig Dispense Refill   amLODipine (NORVASC) 2.5 MG tablet Take 1 tablet (2.5 mg) by mouth once daily at night     aspirin 81 MG EC tablet Take 81 mg by mouth daily.     calcium carbonate (TUMS EX) 750 MG chewable tablet Chew 1 tablet by mouth daily.     cetirizine (ZYRTEC) 10 MG tablet Take 10 mg by mouth daily.     Crisaborole (EUCRISA) 2 % OINT Apply 1 application topically as directed. Qd to bid aa eczema on face, back, hands and legs until clear, then prn flares 100 g 3   levothyroxine (SYNTHROID, LEVOTHROID) 88 MCG tablet Take 88 mcg by mouth daily.     Brimonidine Tartrate (MIRVASO) 0.33 % GEL Apply topically every morning (Patient not taking: No sig reported) 30 g 4   losartan-hydrochlorothiazide (HYZAAR) 50-12.5 MG tablet Take 1.5 tablets by mouth daily. 45 tablet 6   mometasone (ELOCON) 0.1 % ointment Apply topically daily. Apply to aa rash BID x 2 weeks. (Patient not taking: Reported on 01/10/2021) 45 g 0   Oxymetazoline HCl (RHOFADE) 1 % CREA Apply 1 application topically in the morning. Apply to face in the morning (Patient not taking: Reported on 01/10/2021) 30 g 4   Oxymetazoline HCl (RHOFADE) 1 % CREA Apply 1 application topically in the morning. qam to face (Patient not taking: Reported on 01/10/2021) 30 g 4   pantoprazole (  PROTONIX) 40 MG tablet Take 40 mg by mouth daily. (Patient not taking: Reported on 01/10/2021)     simvastatin (ZOCOR) 10 MG tablet Take 10 mg by mouth at bedtime. (Patient not taking: Reported on 01/10/2021)     No current facility-administered medications for this visit.     Allergies:   Ciprofloxacin, Fioricet [butalbital-apap-caffeine], and Sulfa antibiotics   Social History:  The patient  reports that she quit smoking about 35 years ago. Her smoking  use included cigarettes. She smoked an average of 1 pack per day. She has never used smokeless tobacco. She reports current alcohol use. She reports that she does not use drugs.   Family History:   family history includes Breast cancer (age of onset: 60) in her sister; Cancer in her brother, father, mother, sister, sister, and sister.    Review of Systems: Review of Systems  Constitutional: Negative.   HENT: Negative.    Respiratory: Negative.    Cardiovascular: Negative.   Gastrointestinal: Negative.   Musculoskeletal: Negative.   Neurological: Negative.   Psychiatric/Behavioral: Negative.    All other systems reviewed and are negative.   PHYSICAL EXAM: VS:  BP (!) 158/70 (BP Location: Right Arm, Patient Position: Sitting, Cuff Size: Normal)   Pulse 67   Ht 5' 3.5" (1.613 m)   Wt 199 lb 4 oz (90.4 kg)   SpO2 98%   BMI 34.74 kg/m  , BMI Body mass index is 34.74 kg/m. GEN: Well nourished, well developed, in no acute distress HEENT: normal Neck: no JVD, carotid bruits, or masses Cardiac: RRR; no murmurs, rubs, or gallops, trace ankle edema  Respiratory:  clear to auscultation bilaterally, normal work of breathing GI: soft, nontender, nondistended, + BS MS: no deformity or atrophy Skin: warm and dry, no rash Neuro:  Strength and sensation are intact Psych: euthymic mood, full affect   Recent Labs: No results found for requested labs within last 8760 hours.    Lipid Panel No results found for: CHOL, HDL, LDLCALC, TRIG    Wt Readings from Last 3 Encounters:  01/10/21 199 lb 4 oz (90.4 kg)  08/24/17 187 lb (84.8 kg)  06/21/17 182 lb (82.6 kg)       ASSESSMENT AND PLAN:  Problem List Items Addressed This Visit   None Visit Diagnoses     Sinus arrhythmia    -  Primary   Relevant Medications   amLODipine (NORVASC) 2.5 MG tablet   aspirin 81 MG EC tablet   losartan-hydrochlorothiazide (HYZAAR) 50-12.5 MG tablet   Other Relevant Orders   EKG 12-Lead   LONG  TERM MONITOR (3-14 DAYS)   Benign essential HTN       Relevant Medications   amLODipine (NORVASC) 2.5 MG tablet   aspirin 81 MG EC tablet   losartan-hydrochlorothiazide (HYZAAR) 50-12.5 MG tablet   Other Relevant Orders   EKG 12-Lead   Bradycardia       Relevant Orders   EKG 12-Lead   LONG TERM MONITOR (3-14 DAYS)   Cough with exposure to COVID-19 virus          Sinus bradycardia Sinus arrhythmia noted on EKG today possibly explainingBradycardia We have recommended a Zio monitor for further evaluation On EKG today heart rate in the 50s up to 60s, asymptomatic, no intervention needed at this time She is not on beta-blockers.  Denies near syncope or syncope  Sinus arrhythmia Asymptomatic, variable heart rate on EKG EKG discussed in detail Zio monitor ordered  Essential hypertension Numbers elevated  at home We have recommended she increase losartan HCTZ up to 1.5 pills in the morning.  Currently taking losartan HCTZ 50/12.5 daily Continue amlodipine two-point 5 in the evening Discussed that she may need losartan HCTZ 100/25 daily    Total encounter time more than 60 minutes  Greater than 50% was spent in counseling and coordination of care with the patient    Signed, Esmond Plants, M.D., Ph.D. [place], [place]

## 2021-01-10 NOTE — Patient Instructions (Addendum)
ZIO monitor for bradycardia, sinus arrhythmia  Medication Instructions:  Please increase the losartan HCTZ up to 1 1/2 pills daily in the Am Amlodipine in the PM  If you need a refill on your cardiac medications before your next appointment, please call your pharmacy.    Lab work: No new labs needed   If you have labs (blood work) drawn today and your tests are completely normal, you will receive your results only by: [place] (if you have MyChart) OR A paper copy in the mail If you have any lab test that is abnormal or we need to change your treatment, we will call you to review the results.   Testing/Procedures:  - Your physician has recommended that you wear a Zio XT (heart) monitor x 14 days --placed in office today  This monitor is a medical device that records the heart's electrical activity. Doctors most often use these monitors to diagnose arrhythmias. Arrhythmias are problems with the speed or rhythm of the heartbeat. The monitor is a small device applied to your chest. You can wear one while you do your normal daily activities. While wearing this monitor if you have any symptoms to push the button and record what you felt. Once you have worn this monitor for the period of time provider prescribed (Usually 14 days), you will return the monitor device in the postage paid box. Once it is returned they will download the data collected and provide [place] with a report which the provider will then review and we will call you with those results. Important tips:  Avoid showering during the first 24 hours of wearing the monitor. Avoid excessive sweating to help maximize wear time. Do not submerge the device, no hot tubs, and no swimming pools. Keep any lotions or oils away from the patch. After 24 hours you may shower with the patch on. Take brief showers with your back facing the shower head.  Do not remove patch once it has been placed because that will interrupt data and decrease  adhesive wear time. Push the button when you have any symptoms and write down what you were feeling. Once you have completed wearing your monitor, remove and place into box which has postage paid and place in your outgoing mailbox.  If for some reason you have misplaced your box then call our office and we can provide another box and/or mail it off for you.   Follow-Up: At [facility] you and your health needs are our priority.  As part of our continuing mission to provide you with exceptional heart care, we have created designated Provider Care Teams.  These Care Teams include your primary Cardiologist (physician) and Advanced Practice Providers (APPs -  Physician Assistants and Nurse Practitioners) who all work together to provide you with the care you need, when you need it.  You will need a follow up appointment as needed  Providers on your designated Care Team:   Murray Hodgkins, NP Christell Faith, PA-C Marrianne Mood, PA-C Cadence Kathlen Mody, [place]  Any Other Special Instructions Will Be Listed Below (If Applicable).  COVID-19 Vaccine Information can be found at: [email or web address].uk For questions related to vaccine distribution or appointments, please email vaccine@ [email or web address] or call [phone].

## 2021-01-24 DIAGNOSIS — I498 Other specified cardiac arrhythmias: Secondary | ICD-10-CM

## 2021-01-24 DIAGNOSIS — R001 Bradycardia, unspecified: Secondary | ICD-10-CM | POA: Diagnosis not present

## 2021-02-17 ENCOUNTER — Telehealth: Payer: Self-pay

## 2021-02-17 NOTE — Telephone Encounter (Signed)
Left detail message on VM of pt's recent results okay by DPR, Dr. Rockey Situ advised   "Normal sinus rhythm  Average heart rate very reasonable  Patient triggered events not associated with significant arrhythmia  There are some pauses as detailed below, typically overnight during sleeping  She could talk with primary care, see if a sleep study is needed    avg HR of 56 bpm.     4 Supraventricular Tachycardia runs occurred, the run with the fastest interval lasting 10 beats with a max rate of 132 bpm (avg 113 bpm); the run with the fastest interval was also the longest.     14 Pauses occurred, the longest lasting 3.4 secs (18 bpm).  Pauses at midnight, 6 AM, 10 AM.  Pauses were not patient triggered     Isolated SVEs were frequent (6.0%, [id number]), SVE Couplets were rare (<1.0%, 586), and SVE Triplets were rare (<1.0%, 67). Isolated VEs  were rare (<1.0%), VE Couplets were rare (<1.0%), and no VE Triplets were present. "  At this time, no further recommendations or medications changes, would reach out to PCP for sleep study if needed, otherwise advised to call office for any concerns or questions, otherwise will see at next visit.

## 2021-02-17 NOTE — Telephone Encounter (Signed)
Patient did not understand all of the vm .  She has questions. Please call.

## 2021-02-17 NOTE — Telephone Encounter (Signed)
Was able to reach out to pt via phone and make contact to review their recent ZIO monitor results. Dr. Rockey Situ advised based on the current results   Normal sinus rhythm  Average heart rate very reasonable  Patient triggered events not associated with significant arrhythmia  There are some pauses as detailed below, typically overnight during sleeping  She could talk with primary care, see if a sleep study is needed    avg HR of 56 bpm.     4 Supraventricular Tachycardia runs occurred, the run with the fastest interval lasting 10 beats with a max rate of 132 bpm (avg 113 bpm); the run with the fastest interval was also the longest.     14 Pauses occurred, the longest lasting 3.4 secs (18 bpm).  Pauses at midnight, 6 AM, 10 AM.  Pauses were not patient triggered     Isolated SVEs were frequent (6.0%, [id number]), SVE Couplets were rare (<1.0%, 586), and SVE Triplets were rare (<1.0%, 67). Isolated VEs  were rare (<1.0%), VE Couplets were rare (<1.0%), and no VE Triplets were present.   Pt verbalized understanding, is thankful for the results call, all questions and concerns were address. Will call back for anything further, f/u as schedule.

## 2021-02-17 NOTE — Telephone Encounter (Signed)
-----   Message from Minna Merritts, MD sent at 02/16/2021 11:23 AM EST ----- Normal sinus rhythm Average heart rate very reasonable Patient triggered events not associated with significant arrhythmia There are some pauses as detailed below, typically overnight during sleeping She could talk with primary care, see if a sleep study is needed   avg HR of 56 bpm.  4 Supraventricular Tachycardia runs occurred, the run with the fastest interval lasting 10 beats with a max rate of 132 bpm (avg 113 bpm); the run with the fastest interval was also the longest.   14 Pauses occurred, the longest lasting 3.4 secs (18 bpm). Pauses at midnight, 6 AM, 10 AM. Pauses were not patient triggered  Isolated SVEs were frequent (6.0%, [id number]), SVE Couplets were rare (<1.0%, 586), and SVE Triplets were rare (<1.0%, 67). Isolated VEs  were rare (<1.0%), VE Couplets were rare (<1.0%), and no VE Triplets were present.

## 2021-07-30 ENCOUNTER — Ambulatory Visit (INDEPENDENT_AMBULATORY_CARE_PROVIDER_SITE_OTHER): Payer: Medicare Other | Admitting: Dermatology

## 2021-07-30 DIAGNOSIS — D692 Other nonthrombocytopenic purpura: Secondary | ICD-10-CM

## 2021-07-30 DIAGNOSIS — Z1283 Encounter for screening for malignant neoplasm of skin: Secondary | ICD-10-CM | POA: Diagnosis not present

## 2021-07-30 DIAGNOSIS — L853 Xerosis cutis: Secondary | ICD-10-CM

## 2021-07-30 DIAGNOSIS — L2089 Other atopic dermatitis: Secondary | ICD-10-CM | POA: Diagnosis not present

## 2021-07-30 DIAGNOSIS — L814 Other melanin hyperpigmentation: Secondary | ICD-10-CM

## 2021-07-30 DIAGNOSIS — L719 Rosacea, unspecified: Secondary | ICD-10-CM | POA: Diagnosis not present

## 2021-07-30 DIAGNOSIS — Z86018 Personal history of other benign neoplasm: Secondary | ICD-10-CM

## 2021-07-30 DIAGNOSIS — D1800 Hemangioma unspecified site: Secondary | ICD-10-CM

## 2021-07-30 DIAGNOSIS — D229 Melanocytic nevi, unspecified: Secondary | ICD-10-CM

## 2021-07-30 DIAGNOSIS — L509 Urticaria, unspecified: Secondary | ICD-10-CM

## 2021-07-30 DIAGNOSIS — L578 Other skin changes due to chronic exposure to nonionizing radiation: Secondary | ICD-10-CM

## 2021-07-30 DIAGNOSIS — I8393 Asymptomatic varicose veins of bilateral lower extremities: Secondary | ICD-10-CM

## 2021-07-30 DIAGNOSIS — L821 Other seborrheic keratosis: Secondary | ICD-10-CM

## 2021-07-30 NOTE — Progress Notes (Signed)
? ?Follow-Up Visit ?  ?Subjective  ?Heidi Richards is a 76 y.o. female who presents for the following: Annual Exam (Hx dysplastic nevus ). The patient presents for Total-Body Skin Exam (TBSE) for skin cancer screening and mole check.  The patient has spots, moles and lesions to be evaluated, some may be new or changing. ? ?The following portions of the chart were reviewed this encounter and updated as appropriate:  ? Tobacco  Allergies  Meds  Problems  Med Hx  Surg Hx  Fam Hx   ?  ?Review of Systems:  No other skin or systemic complaints except as noted in HPI or Assessment and Plan. ? ?Objective  ?Well appearing patient in no apparent distress; mood and affect are within normal limits. ? ?A full examination was performed including scalp, head, eyes, ears, nose, lips, neck, chest, axillae, abdomen, back, buttocks, bilateral upper extremities, bilateral lower extremities, hands, feet, fingers, toes, fingernails, and toenails. All findings within normal limits unless otherwise noted below. ? ?Trunk, extremities, face ?Eczematous patch on the upper back with excoriations.  ? ?Face ?Pinkness of the face.  ? ? ?Assessment & Plan  ?Other atopic dermatitis ?Trunk, extremities, face ?With pruritus and urticaria -  ?Ok to increase Zyrtec to 2-4 tabs po QD. Gradually increase as needed. Do not take more than 4 tabs po QD.   ? ?Continue Eucrisa 2% ointment to aa's QD-BID PRN.  ?Start [facility] anti-itch cream apply to aa's TID PRN.  ? ?Atopic dermatitis (eczema) is a chronic, relapsing, pruritic condition that can significantly affect quality of life. It is often associated with allergic rhinitis and/or asthma and can require treatment with topical medications, phototherapy, or in severe cases biologic injectable medication (Dupixent; Adbry) or Oral JAK inhibitors. ? ?Rosacea ?Face ?Rosacea is a chronic progressive skin condition usually affecting the face of adults, causing redness and/or acne bumps. It is  treatable but not curable. It sometimes affects the eyes (ocular rosacea) as well. It may respond to topical and/or systemic medication and can flare with stress, sun exposure, alcohol, exercise and some foods.  Daily application of broad spectrum spf 30+ sunscreen to face is recommended to reduce flares. ? ?Discussed the treatment option of BBL/laser.  Typically we recommend 1-3 treatment sessions about 5-8 weeks apart for best results.  The patient's condition may require "maintenance treatments" in the future.  The fee for BBL / laser treatments is $350 per treatment session for the whole face.  A fee can be quoted for other parts of the body. ?Insurance typically does not pay for BBL/laser treatments and therefore the fee is an out-of-pocket cost. ? ?Related Medications ?Oxymetazoline HCl (RHOFADE) 1 % CREA ?Apply 1 application topically in the morning. Apply to face in the morning ? ?Lentigines ?- Scattered tan macules ?- Due to sun exposure ?- Benign-appearing, observe ?- Recommend daily broad spectrum sunscreen SPF 30+ to sun-exposed areas, reapply every 2 hours as needed. ?- Call for any changes ? ?Seborrheic Keratoses ?- Stuck-on, waxy, tan-brown papules and/or plaques  ?- Benign-appearing ?- Discussed benign etiology and prognosis. ?- Observe ?- Call for any changes ? ?Melanocytic Nevi ?- Tan-brown and/or pink-flesh-colored symmetric macules and papules ?- Benign appearing on exam today ?- Observation ?- Call clinic for new or changing moles ?- Recommend daily use of broad spectrum spf 30+ sunscreen to sun-exposed areas.  ? ?Hemangiomas ?- Red papules ?- Discussed benign nature ?- Observe ?- Call for any changes ? ?Actinic Damage ?- Chronic condition, secondary  to cumulative UV/sun exposure ?- diffuse scaly erythematous macules with underlying dyspigmentation ?- Recommend daily broad spectrum sunscreen SPF 30+ to sun-exposed areas, reapply every 2 hours as needed.  ?- Staying in the shade or wearing long  sleeves, sun glasses (UVA+UVB protection) and wide brim hats (4-inch brim around the entire circumference of the hat) are also recommended for sun protection.  ?- Call for new or changing lesions. ? ?History of Dysplastic Nevus ?- No evidence of recurrence today ?- Recommend regular full body skin exams ?- Recommend daily broad spectrum sunscreen SPF 30+ to sun-exposed areas, reapply every 2 hours as needed.  ?- Call if any new or changing lesions are noted between office visits ? ?Xerosis ?- diffuse xerotic patches ?- recommend gentle, hydrating skin care ?- gentle skin care handout given ? ?Varicose Veins/Spider Veins ?- Dilated blue, purple or red veins at the lower extremities ?- Reassured ?- Smaller vessels can be treated by sclerotherapy (a procedure to inject a medicine into the veins to make them disappear) if desired, but the treatment is not covered by insurance. Larger vessels may be covered if symptomatic and we would refer to vascular surgeon if treatment desired. ? ?Purpura - Chronic; persistent and recurrent.  Treatable, but not curable. ?- Violaceous macules and patches ?- Benign ?- Related to trauma, age, sun damage and/or use of blood thinners, chronic use of topical and/or oral steroids ?- Observe ?- Can use OTC arnica containing moisturizer such as Dermend Bruise Formula if desired ?- Call for worsening or other concerns ? ?Skin cancer screening performed today. ? ?Return in about 1 year (around 07/31/2022) for TBSE. ? ?I, Rudell Cobb, CMA, am acting as scribe for Sarina Ser, MD . ?Documentation: I have reviewed the above documentation for accuracy and completeness, and I agree with the above. ? ?Sarina Ser, MD ? ? ?

## 2021-07-30 NOTE — Patient Instructions (Addendum)
Instructions for [facility] Medications ? ?One or more of your medications was sent to the [facility] mail order compounding pharmacy. You will receive an email from them and can purchase the medicine through that link. It will then be mailed to your home at the address you confirmed. If for any reason you do not receive an email from them, please check your spam folder. If you still do not find the email, please let us know. [facility] phone number is [phone]. ? ?Gentle Skin Care Guide ? ?1. Bathe no more than once a day. ? ?2. Avoid bathing in hot water ? ?3. Use a mild soap like Dove, Vanicream, Cetaphil, CeraVe. Can use Lever 2000 or Cetaphil antibacterial soap ? ?4. Use soap only where you need it. On most days, use it under your arms, between your legs, and on your feet. Let the water rinse other areas unless visibly dirty. ? ?5. When you get out of the bath/shower, use a towel to gently blot your skin dry, don't rub it. ? ?6. While your skin is still a little damp, apply a moisturizing cream such as Vanicream, CeraVe, Cetaphil, Eucerin, Sarna lotion or plain Vaseline Jelly. For hands apply Neutrogena [place] Hand Cream or Excipial Hand Cream. ? ?7. Reapply moisturizer any time you start to itch or feel dry. ? ?8. Sometimes using free and clear laundry detergents can be helpful. Fabric softener sheets should be avoided. Downy Free & Gentle liquid, or any liquid fabric softener that is free of dyes and perfumes, it acceptable to use ? ?9. If your doctor has given you prescription creams you may apply moisturizers over them  ? ? ? ? ?If You Need Anything After Your Visit ? ?If you have any questions or concerns for your doctor, please call our main line at [phone] and press option 4 to reach your doctor's medical assistant. If no one answers, please leave a voicemail as directed and we will return your call as soon as possible. Messages left after 4 pm will be answered the following  business day.  ? ?You may also send [place] a message via MyChart. We typically respond to MyChart messages within 1-2 business days. ? ?For prescription refills, please ask your pharmacy to contact our office. Our fax number is [phone]. ? ?If you have an urgent issue when the clinic is closed that cannot wait until the next business day, you can page your doctor at the number below.   ? ?Please note that while we do our best to be available for urgent issues outside of office hours, we are not available 24/7.  ? ?If you have an urgent issue and are unable to reach [place], you may choose to seek medical care at your doctor's office, retail clinic, urgent care center, or emergency room. ? ?If you have a medical emergency, please immediately call 911 or go to the emergency department. ? ?Pager Numbers ? ?- Dr. Nehemiah Massed: [phone] ? ?- Dr. Laurence Ferrari: [phone] ? ?- Dr. Nicole Kindred: [phone] ? ?In the event of inclement weather, please call our main line at [phone] for an update on the status of any delays or closures. ? ?Dermatology Medication Tips: ?Please keep the boxes that topical medications come in in order to help keep track of the instructions about where and how to use these. Pharmacies typically print the medication instructions only on the boxes and not directly on the medication tubes.  ? ?If your medication is too expensive, please contact our  office at [phone] option 4 or send [place] a message through [place].  ? ?We are unable to tell what your co-pay for medications will be in advance as this is different depending on your insurance coverage. However, we may be able to find a substitute medication at lower cost or fill out paperwork to get insurance to cover a needed medication.  ? ?If a prior authorization is required to get your medication covered by your insurance company, please allow [place] 1-2 business days to complete this process. ? ?Drug prices often vary depending on where the prescription is  filled and some pharmacies may offer cheaper prices. ? ?The website www.goodrx.com contains coupons for medications through different pharmacies. The prices here do not account for what the cost may be with help from insurance (it may be cheaper with your insurance), but the website can give you the price if you did not use any insurance.  ?- You can print the associated coupon and take it with your prescription to the pharmacy.  ?- You may also stop by our office during regular business hours and pick up a GoodRx coupon card.  ?- If you need your prescription sent electronically to a different pharmacy, notify our office through [facility] or by phone at [phone] option 4. ? ? ? ? ?Si Usted Necesita Algo Despu?s de Su Visita ? ?Tambi?n puede enviarnos un mensaje a trav?s de MyChart. Por lo general respondemos a los mensajes de MyChart en el transcurso de 1 a 2 d?as h?biles. ? ?Para renovar recetas, por favor pida a su farmacia que se ponga en contacto con nuestra oficina. Nuestro n?mero de fax es el [phone]. ? ?Si tiene un asunto urgente cuando la cl?nica est? cerrada y que no puede esperar hasta el siguiente d?a h?bil, puede llamar/localizar a su doctor(a) al n?mero que aparece a continuaci?n.  ? ?Por favor, tenga en cuenta que aunque hacemos todo lo posible para estar disponibles para asuntos urgentes fuera del horario de oficina, no estamos disponibles las 24 horas del d?a, los 7 d?as de la semana.  ? ?Si tiene un problema urgente y no puede comunicarse con nosotros, puede optar por buscar atenci?n m?dica  en el consultorio de su doctor(a), en una cl?nica privada, en un centro de atenci?n urgente o en una sala de emergencias. ? ?Si tiene Engineer, maintenance (IT) m?dica, por favor llame inmediatamente al 911 o vaya a la sala de emergencias. ? ?N?meros de b?per ? ?- Dr. Nehemiah Massed: [phone] ? ?- Dra. Moye: [phone] ? ?- Dra. Nicole Kindred: [phone] ? ?En caso de inclemencias del tiempo, por favor llame  a nuestra l?nea principal al [phone] para una actualizaci?n sobre el estado de cualquier retraso o cierre. ? ?Consejos para la medicaci?n en dermatolog?a: ?Por favor, guarde las cajas en las que vienen los medicamentos de uso t?pico para ayudarle a seguir las instrucciones sobre d?nde y c?mo usarlos. Las farmacias generalmente imprimen las instrucciones del medicamento s?lo en las cajas y no directamente en los tubos del [place].  ? ?Si su medicamento es muy caro, por favor, p?ngase en contacto con Zigmund Daniel llamando al [phone] y presione la opci?n 4 o env?enos un mensaje a trav?s de MyChart.  ? ?No podemos decirle cu?l ser? su copago por los medicamentos por adelantado ya que esto es diferente dependiendo de la cobertura de su seguro. Sin embargo, es posible que podamos encontrar un medicamento sustituto a Electrical engineer un formulario para que el seguro cubra el medicamento que  se considera necesario.  ? ?Si se requiere Ardelia Mems autorizaci?n previa para que su compa??a de seguros [place] su medicamento, por favor perm?tanos de 1 a 2 d?as h?biles para completar este proceso. ? ?Los precios de los medicamentos var?an con frecuencia dependiendo del Environmental consultant de d?nde se surte la receta y alguna farmacias pueden ofrecer precios m?s baratos. ? ?El sitio web www.goodrx.com tiene cupones para medicamentos de Airline pilot. Los precios aqu? no tienen en cuenta lo que podr?a costar con la ayuda del seguro (puede ser m?s barato con su seguro), pero el sitio web puede darle el precio si no utiliz? ning?n seguro.  ?- Puede imprimir el cup?n correspondiente y llevarlo con su receta a la farmacia.  ?- Tambi?n puede pasar por nuestra oficina durante el horario de atenci?n regular y recoger una tarjeta de cupones de GoodRx.  ?- Si necesita que su receta se env?e electr?nicamente a Chiropodist, informe a nuestra oficina a trav?s de MyChart de [place] o por tel?fono llamando al [phone] y  presione la opci?n 4. ? ?

## 2021-08-05 ENCOUNTER — Encounter: Payer: Self-pay | Admitting: Dermatology

## 2021-11-30 IMAGING — MG MM DIGITAL SCREENING BILAT W/ TOMO AND CAD
8 series · 8 of 24 positions shown · non-contrast
Comparison: Previous exam(s).

ACR Breast Density Category a: The breast tissue is almost entirely
fatty.

CLINICAL DATA: Screening.

EXAM:
DIGITAL SCREENING BILATERAL MAMMOGRAM WITH TOMOSYNTHESIS AND CAD
TECHNIQUE: Bilateral screening digital craniocaudal and mediolateral oblique
mammograms were obtained. Bilateral screening digital breast
tomosynthesis was performed. The images were evaluated with
computer-aided detection.

[R MLO synth-2D]
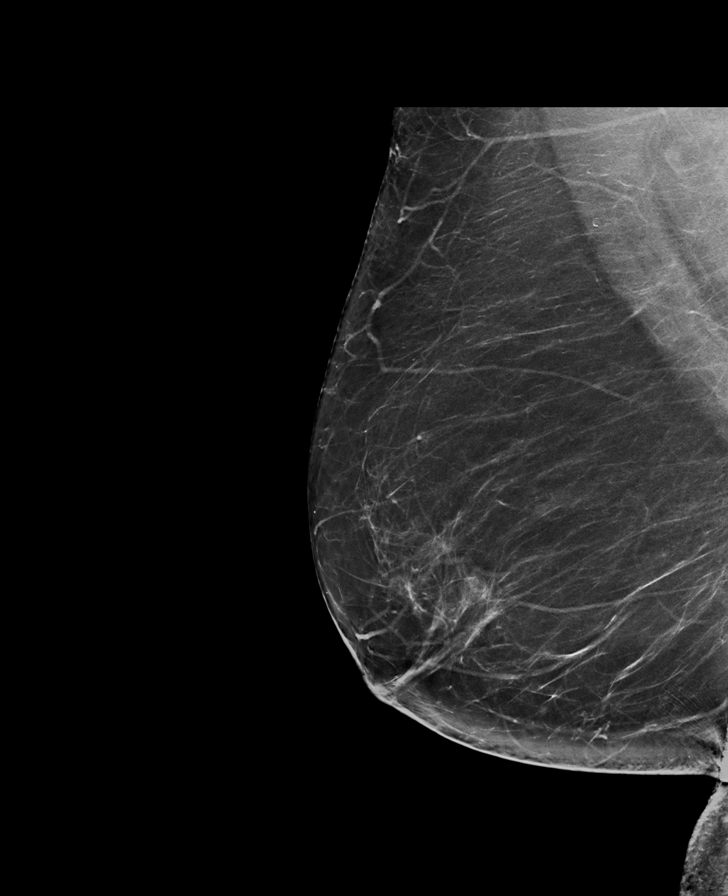

[L MLO synth-2D]
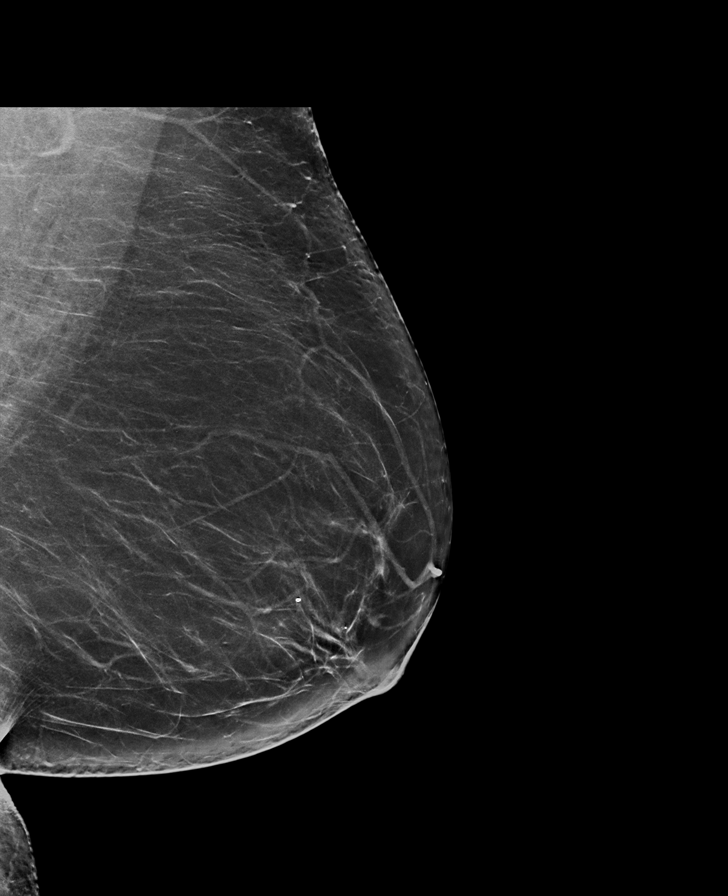

[L CC synth-2D]
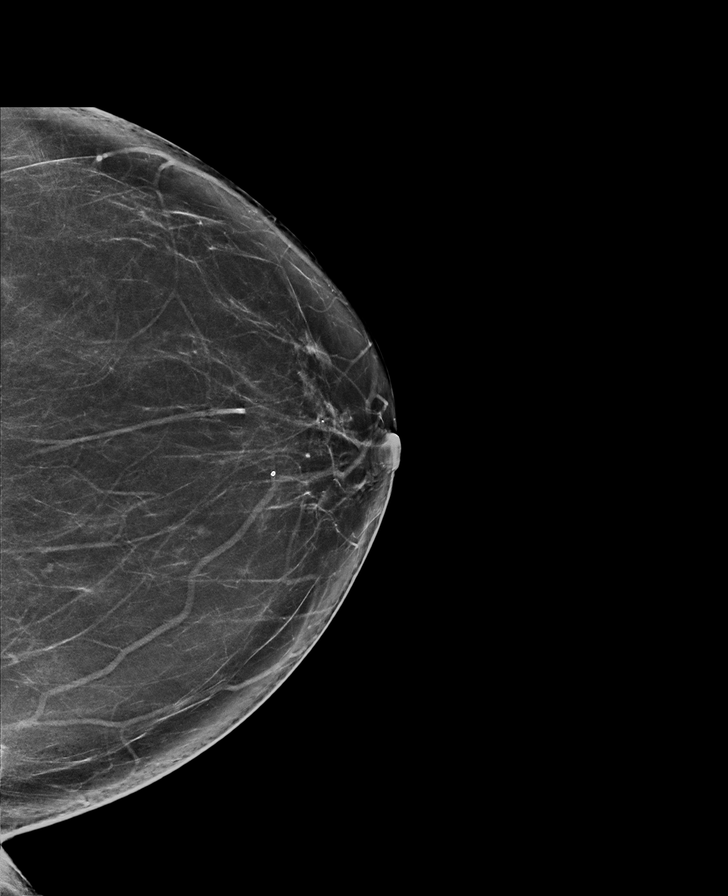

[R CC synth-2D]
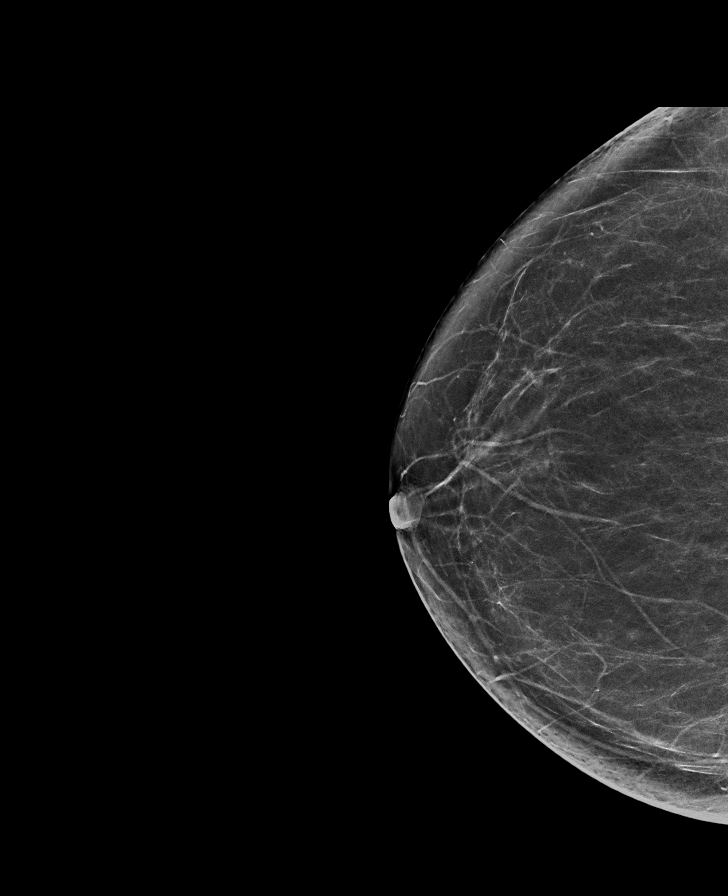

[L CC tomo · tomo slice 39/78.0]
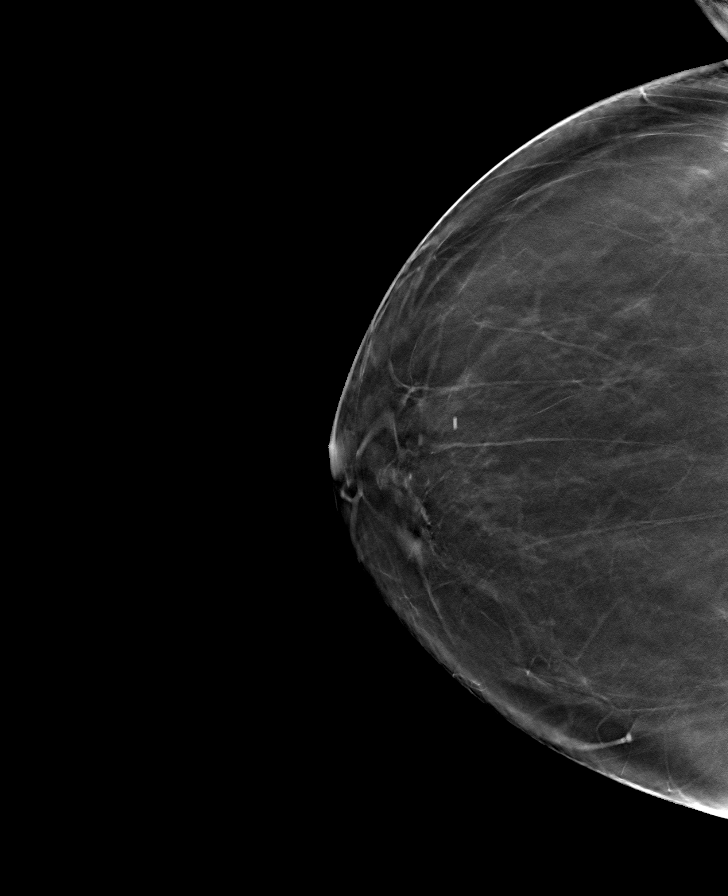

[L MLO tomo · tomo slice 42/83.0]
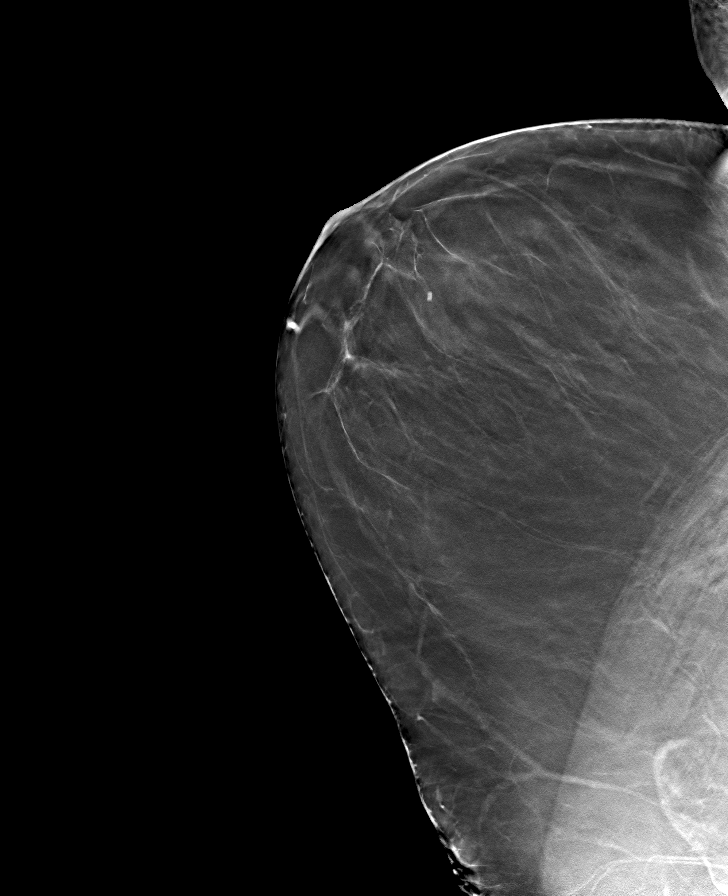

[R MLO tomo · tomo slice 42/83.0]
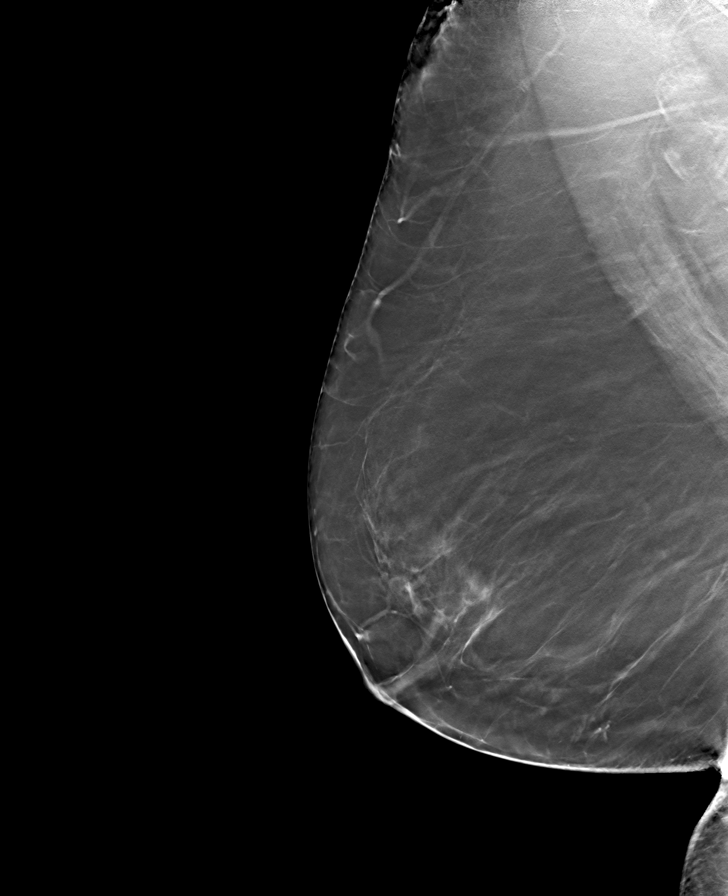

[R CC tomo · tomo slice 37/72.0]
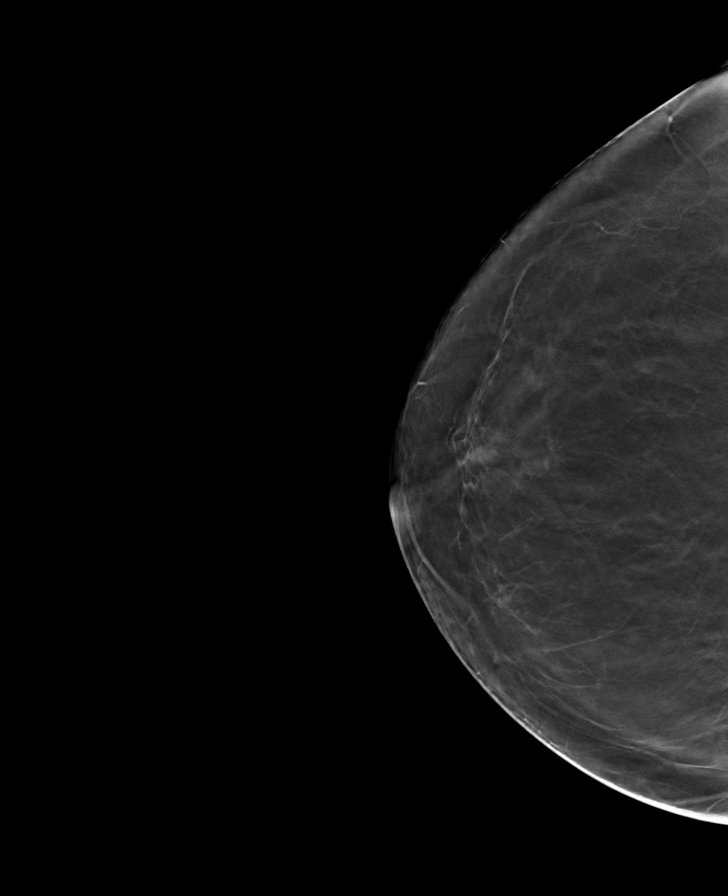

[8 of 24 positions shown; findings below may reference images not displayed]

FINDINGS: There are no findings suspicious for malignancy.
IMPRESSION: No mammographic evidence of malignancy. A result letter of this
screening mammogram will be mailed directly to the patient.

RECOMMENDATION:
Screening mammogram in one year. (Code:0E-[id number])

BI-RADS CATEGORY  1: Negative.

## 2021-12-04 ENCOUNTER — Other Ambulatory Visit: Payer: Self-pay | Admitting: Internal Medicine

## 2021-12-04 DIAGNOSIS — Z1231 Encounter for screening mammogram for malignant neoplasm of breast: Secondary | ICD-10-CM

## 2021-12-31 ENCOUNTER — Ambulatory Visit
Admission: RE | Admit: 2021-12-31 | Discharge: 2021-12-31 | Disposition: A | Discharge status: Home / Self Care | Payer: Medicare Other | Source: Ambulatory Visit | Attending: Internal Medicine | Admitting: Internal Medicine

## 2021-12-31 DIAGNOSIS — Z1231 Encounter for screening mammogram for malignant neoplasm of breast: Secondary | ICD-10-CM | POA: Diagnosis present

## 2022-02-23 ENCOUNTER — Ambulatory Visit (INDEPENDENT_AMBULATORY_CARE_PROVIDER_SITE_OTHER): Payer: Medicare Other | Admitting: Dermatology

## 2022-02-23 DIAGNOSIS — L299 Pruritus, unspecified: Secondary | ICD-10-CM

## 2022-02-23 DIAGNOSIS — L509 Urticaria, unspecified: Secondary | ICD-10-CM | POA: Diagnosis not present

## 2022-02-23 DIAGNOSIS — L2089 Other atopic dermatitis: Secondary | ICD-10-CM | POA: Diagnosis not present

## 2022-02-23 DIAGNOSIS — R21 Rash and other nonspecific skin eruption: Secondary | ICD-10-CM

## 2022-02-23 NOTE — Patient Instructions (Addendum)
Start Fexofinadine (Allegra) 180 mg take 2 pills once daily. Start Opzelura samples to affected areas of the face once daily as needed.   Due to recent changes in healthcare laws, you may see results of your pathology and/or laboratory studies on MyChart before the doctors have had a chance to review them. We understand that in some cases there may be results that are confusing or concerning to you. Please understand that not all results are received at the same time and often the doctors may need to interpret multiple results in order to provide you with the best plan of care or course of treatment. Therefore, we ask that you please give [place] 2 business days to thoroughly review all your results before contacting the office for clarification. Should we see a critical lab result, you will be contacted sooner.   If You Need Anything After Your Visit  If you have any questions or concerns for your doctor, please call our main line at [phone] and press option 4 to reach your doctor's medical assistant. If no one answers, please leave a voicemail as directed and we will return your call as soon as possible. Messages left after 4 pm will be answered the following business day.   You may also send [place] a message via [place]. We typically respond to MyChart messages within 1-2 business days.  For prescription refills, please ask your pharmacy to contact our office. Our fax number is [phone].  If you have an urgent issue when the clinic is closed that cannot wait until the next business day, you can page your doctor at the number below.    Please note that while we do our best to be available for urgent issues outside of office hours, we are not available 24/7.   If you have an urgent issue and are unable to reach [place], you may choose to seek medical care at your doctor's office, retail clinic, urgent care center, or emergency room.  If you have a medical emergency, please immediately call 911 or go  to the emergency department.  Pager Numbers  - Dr. Nehemiah Massed: [phone]  - Dr. Laurence Ferrari: [phone]  - Dr. Nicole Kindred: [phone]  In the event of inclement weather, please call our main line at [phone] for an update on the status of any delays or closures.  Dermatology Medication Tips: Please keep the boxes that topical medications come in in order to help keep track of the instructions about where and how to use these. Pharmacies typically print the medication instructions only on the boxes and not directly on the medication tubes.   If your medication is too expensive, please contact our office at [phone] option 4 or send [place] a message through [place].   We are unable to tell what your co-pay for medications will be in advance as this is different depending on your insurance coverage. However, we may be able to find a substitute medication at lower cost or fill out paperwork to get insurance to cover a needed medication.   If a prior authorization is required to get your medication covered by your insurance company, please allow [place] 1-2 business days to complete this process.  Drug prices often vary depending on where the prescription is filled and some pharmacies may offer cheaper prices.  The website www.goodrx.com contains coupons for medications through different pharmacies. The prices here do not account for what the cost may be with help from insurance (it may be cheaper with your insurance), but the  website can give you the price if you did not use any insurance.  - You can print the associated coupon and take it with your prescription to the pharmacy.  - You may also stop by our office during regular business hours and pick up a GoodRx coupon card.  - If you need your prescription sent electronically to a different pharmacy, notify our office through [facility] or by phone at [phone] option 4.     Si Usted Necesita Algo Despus de Su Visita  Tambin  puede enviarnos un mensaje a travs de Pharmacist, community. Por lo general respondemos a los mensajes de MyChart en el transcurso de 1 a 2 das hbiles.  Para renovar recetas, por favor pida a su farmacia que se ponga en contacto con nuestra oficina. Harland Dingwall de fax es [place] [phone].  Si tiene un asunto urgente cuando la clnica est cerrada y que no puede esperar hasta el siguiente da hbil, puede llamar/localizar a su doctor(a) al nmero que aparece a continuacin.   Por favor, tenga en cuenta que aunque hacemos todo lo posible para estar disponibles para asuntos urgentes fuera del horario de [place], no estamos disponibles las 24 horas del da, los 7 das de la [place].   Si tiene un problema urgente y no puede comunicarse con nosotros, puede optar por buscar atencin mdica  en el consultorio de su doctor(a), en una clnica privada, en un centro de atencin urgente o en una sala de emergencias.  Si tiene Engineering geologist, por favor llame inmediatamente al 911 o vaya a la sala de emergencias.  Nmeros de bper  - Dr. Nehemiah Massed: [phone]  - Dra. Moye: [phone]  - Dra. Nicole Kindred: [phone]  En caso de inclemencias del [place], por favor llame a Johnsie Kindred principal al [phone] para una actualizacin sobre el [place] de cualquier retraso o cierre.  Consejos para la medicacin en dermatologa: Por favor, guarde las cajas en las que vienen los medicamentos de uso tpico para ayudarle a seguir las instrucciones sobre dnde y cmo usarlos. Las farmacias generalmente imprimen las instrucciones del medicamento slo en las cajas y no directamente en los tubos del [place].   Si su medicamento es muy caro, por favor, pngase en contacto con Zigmund Daniel llamando al [phone] y presione la opcin 4 o envenos un mensaje a travs de Pharmacist, community.   No podemos decirle cul ser su copago por los medicamentos por adelantado ya que esto es diferente dependiendo de la cobertura de su  seguro. Sin embargo, es posible que podamos encontrar un medicamento sustituto a Electrical engineer un formulario para que el seguro cubra el medicamento que se considera necesario.   Si se requiere una autorizacin previa para que su compaa de seguros [place] su medicamento, por favor permtanos de 1 a 2 das hbiles para completar este proceso.  Los precios de los medicamentos varan con frecuencia dependiendo del Environmental consultant de dnde se surte la receta y alguna farmacias pueden ofrecer precios ms baratos.  El sitio web www.goodrx.com tiene cupones para medicamentos de Airline pilot. Los precios aqu no tienen en cuenta lo que podra costar con la ayuda del seguro (puede ser ms barato con su seguro), pero el sitio web puede darle el precio si no utiliz Research scientist (physical sciences).  - Puede imprimir el cupn correspondiente y llevarlo con su receta a la farmacia.  - Tambin puede pasar por nuestra oficina durante el horario de atencin regular y Charity fundraiser una tarjeta de cupones de GoodRx.  -  Si necesita que su receta se enve electrnicamente a una farmacia diferente, informe a nuestra oficina a travs de MyChart de [place] o por telfono llamando al [phone] y presione la opcin 4.  

## 2022-02-23 NOTE — Progress Notes (Signed)
   Follow-Up Visit   Subjective  Heidi Richards is a 76 y.o. female who presents for the following: Rash (Peeling, burning, redness of the face - started a few months ago, has been using [place] but it makes her hair oily. Patient is concerned and would like to discuss treatment options).  The following portions of the chart were reviewed this encounter and updated as appropriate:   Tobacco  Allergies  Meds  Problems  Med Hx  Surg Hx  Fam Hx     Review of Systems:  No other skin or systemic complaints except as noted in HPI or Assessment and Plan.  Objective  Well appearing patient in no apparent distress; mood and affect are within normal limits.  A focused examination was performed including the face and scalp. Relevant physical exam findings are noted in the Assessment and Plan.  Face, scalp Pinkness and peeling of the face and upper back.   Assessment & Plan  Rash Face, scalp Atopic dermatitis with urticaria and pruritus vs contact dermatitis -   Chronic and persistent condition with duration or expected duration over one year. Condition is symptomatic / bothersome to patient. Not to goal.  Samples given of Opzelura - apply to aa's QD PRN.   D/C Zyrtec. Start Allegra '180mg'$  2 po QD patient to report condition when she returns on Wednesday and we may change dosing.   Patch testing was performed on the back using standard technique. True test x 36 applied to back at visit today. Pt advised to keep panels dry until removal in 2 days for first read.   Patch Test - Face, scalp  Return for patch testing on day 2 and 7.  Luther Redo, CMA, am acting as scribe for Sarina Ser, MD . Documentation: I have reviewed the above documentation for accuracy and completeness, and I agree with the above.  Sarina Ser, MD

## 2022-02-25 ENCOUNTER — Ambulatory Visit (INDEPENDENT_AMBULATORY_CARE_PROVIDER_SITE_OTHER): Payer: Medicare Other | Admitting: Dermatology

## 2022-02-25 DIAGNOSIS — R21 Rash and other nonspecific skin eruption: Secondary | ICD-10-CM

## 2022-02-25 NOTE — Patient Instructions (Signed)
Due to recent changes in healthcare laws, you may see results of your pathology and/or laboratory studies on MyChart before the doctors have had a chance to review them. We understand that in some cases there may be results that are confusing or concerning to you. Please understand that not all results are received at the same time and often the doctors may need to interpret multiple results in order to provide you with the best plan of care or course of treatment. Therefore, we ask that you please give us 2 business days to thoroughly review all your results before contacting the office for clarification. Should we see a critical lab result, you will be contacted sooner.   If You Need Anything After Your Visit  If you have any questions or concerns for your doctor, please call our main line at [phone] and press option 4 to reach your doctor's medical assistant. If no one answers, please leave a voicemail as directed and we will return your call as soon as possible. Messages left after 4 pm will be answered the following business day.   You may also send us a message via MyChart. We typically respond to MyChart messages within 1-2 business days.  For prescription refills, please ask your pharmacy to contact our office. Our fax number is [phone].  If you have an urgent issue when the clinic is closed that cannot wait until the next business day, you can page your doctor at the number below.    Please note that while we do our best to be available for urgent issues outside of office hours, we are not available 24/7.   If you have an urgent issue and are unable to reach us, you may choose to seek medical care at your doctor's office, retail clinic, urgent care center, or emergency room.  If you have a medical emergency, please immediately call 911 or go to the emergency department.  Pager Numbers  - Dr. Kowalski: [phone]  - Dr. Moye: [phone]  - Dr. Stewart:  [phone]  In the event of inclement weather, please call our main line at [phone] for an update on the status of any delays or closures.  Dermatology Medication Tips: Please keep the boxes that topical medications come in in order to help keep track of the instructions about where and how to use these. Pharmacies typically print the medication instructions only on the boxes and not directly on the medication tubes.   If your medication is too expensive, please contact our office at [phone] option 4 or send us a message through MyChart.   We are unable to tell what your co-pay for medications will be in advance as this is different depending on your insurance coverage. However, we may be able to find a substitute medication at lower cost or fill out paperwork to get insurance to cover a needed medication.   If a prior authorization is required to get your medication covered by your insurance company, please allow us 1-2 business days to complete this process.  Drug prices often vary depending on where the prescription is filled and some pharmacies may offer cheaper prices.  The website www.goodrx.com contains coupons for medications through different pharmacies. The prices here do not account for what the cost may be with help from insurance (it may be cheaper with your insurance), but the website can give you the price if you did not use any insurance.  - You can print the associated coupon and take it with   your prescription to the pharmacy.  - You may also stop by our office during regular business hours and pick up a GoodRx coupon card.  - If you need your prescription sent electronically to a different pharmacy, notify our office through [facility] MyChart or by phone at [phone] option 4.     Si Usted Necesita Algo Despus de Su Visita  Tambin puede enviarnos un mensaje a travs de MyChart. Por lo general respondemos a los mensajes de MyChart en el transcurso de 1 a 2  das hbiles.  Para renovar recetas, por favor pida a su farmacia que se ponga en contacto con nuestra oficina. Nuestro nmero de fax es el [phone].  Si tiene un asunto urgente cuando la clnica est cerrada y que no puede esperar hasta el siguiente da hbil, puede llamar/localizar a su doctor(a) al nmero que aparece a continuacin.   Por favor, tenga en cuenta que aunque hacemos todo lo posible para estar disponibles para asuntos urgentes fuera del horario de oficina, no estamos disponibles las 24 horas del da, los 7 das de la semana.   Si tiene un problema urgente y no puede comunicarse con nosotros, puede optar por buscar atencin mdica  en el consultorio de su doctor(a), en una clnica privada, en un centro de atencin urgente o en una sala de emergencias.  Si tiene una emergencia mdica, por favor llame inmediatamente al 911 o vaya a la sala de emergencias.  Nmeros de bper  - Dr. Kowalski: [phone]  - Dra. Moye: [phone]  - Dra. Stewart: [phone]  En caso de inclemencias del tiempo, por favor llame a nuestra lnea principal al [phone] para una actualizacin sobre el estado de cualquier retraso o cierre.  Consejos para la medicacin en dermatologa: Por favor, guarde las cajas en las que vienen los medicamentos de uso tpico para ayudarle a seguir las instrucciones sobre dnde y cmo usarlos. Las farmacias generalmente imprimen las instrucciones del medicamento slo en las cajas y no directamente en los tubos del medicamento.   Si su medicamento es muy caro, por favor, pngase en contacto con nuestra oficina llamando al [phone] y presione la opcin 4 o envenos un mensaje a travs de MyChart.   No podemos decirle cul ser su copago por los medicamentos por adelantado ya que esto es diferente dependiendo de la cobertura de su seguro. Sin embargo, es posible que podamos encontrar un medicamento sustituto a menor costo o llenar un formulario para que el  seguro cubra el medicamento que se considera necesario.   Si se requiere una autorizacin previa para que su compaa de seguros cubra su medicamento, por favor permtanos de 1 a 2 das hbiles para completar este proceso.  Los precios de los medicamentos varan con frecuencia dependiendo del lugar de dnde se surte la receta y alguna farmacias pueden ofrecer precios ms baratos.  El sitio web www.goodrx.com tiene cupones para medicamentos de diferentes farmacias. Los precios aqu no tienen en cuenta lo que podra costar con la ayuda del seguro (puede ser ms barato con su seguro), pero el sitio web puede darle el precio si no utiliz ningn seguro.  - Puede imprimir el cupn correspondiente y llevarlo con su receta a la farmacia.  - Tambin puede pasar por nuestra oficina durante el horario de atencin regular y recoger una tarjeta de cupones de GoodRx.  - Si necesita que su receta se enve electrnicamente a una farmacia diferente, informe a nuestra oficina a travs de MyChart de [facility]   o por telfono llamando al [phone] y presione la opcin 4.  

## 2022-02-25 NOTE — Progress Notes (Signed)
   Follow-Up Visit   Subjective  Heidi Richards is a 76 y.o. female who presents for the following: Follow-up (2nd patch test reading).    T.R.U.E. Test - 02/25/22 1700       Test Information   Location Back    Reading Interval Day 3    Panel Panel 1;Panel 2;Panel 3      Panel 1   1. Nickel Sulfate 0    2. Wool Alcohols 0    3. Neomycin Sulfate 0    4. Potassium Dichromate 0    5. Caine Mix 0    6. Fragrance Mix 0    7. Colophony 0    8. Paraben Mix 0    9. Negative Control 0    10. Balsam of [place] 0    11. Ethylenediamine Dihydrochloride 0    12. Cobalt Dichloride 0      Panel 2   13. p-tert Butylphenol Formaldehyde Resin 0    14. Epoxy Resin 0    15. Carba Mix 0    16.  Black Rubber Mix 0    17. Cl+ Me-Isothiazolinone 0    18. Quaternium-15 0    19. Methyldibromo Glutaronitrile 0    20. p-Phenylenediamine 0    21. Formaldehyde 0    22. Mercapto Mix 0    23. Thimerosal 0    24. Thiuram Mix 0      Panel 3   25. Diazolidinyl Urea 0    26. Quinoline Mix 0    27. Tixocortol-21-Pivalate 0    28. Gold Sodium Thiosulfate 0    29. Imidazolidinyl Urea 0    30. Budesonide 0    31. Hydrocortisone-17-Butyrate 0    32. Mercaptobenzothiazole 0    33. Bacitracin 0    34. Parthenolide 0    35. Disperse Blue 106 0    36. 2-Bromo-2-Nitropropane-1,3-diol 0             The following portions of the chart were reviewed this encounter and updated as appropriate:   Tobacco  Allergies  Meds  Problems  Med Hx  Surg Hx  Fam Hx     Review of Systems:  No other skin or systemic complaints except as noted in HPI or Assessment and Plan.  Objective  Well appearing patient in no apparent distress; mood and affect are within normal limits.  A focused examination was performed including back . Relevant physical exam findings are noted in the Assessment and Plan.  back No reactions noticed today             Assessment & Plan  Rash and other nonspecific skin  eruption R/O Allergic Contact Dermatitis back  Atopic dermatitis with urticaria and pruritus vs contact dermatitis -  Continue  of Opzelura - apply to aa's QD PRN.  Increase  Allegra '180mg'$  to 3 po QD   No reactions today on Patch test sites.  Return for 7 day patch test reading Nov 20.  IMarye Round, CMA, am acting as scribe for Sarina Ser, MD .  Documentation: I have reviewed the above documentation for accuracy and completeness, and I agree with the above.  Sarina Ser, MD

## 2022-02-28 ENCOUNTER — Encounter: Payer: Self-pay | Admitting: Dermatology

## 2022-03-02 ENCOUNTER — Ambulatory Visit (INDEPENDENT_AMBULATORY_CARE_PROVIDER_SITE_OTHER): Payer: Medicare Other | Admitting: Dermatology

## 2022-03-02 DIAGNOSIS — L230 Allergic contact dermatitis due to metals: Secondary | ICD-10-CM

## 2022-03-02 DIAGNOSIS — R21 Rash and other nonspecific skin eruption: Secondary | ICD-10-CM | POA: Diagnosis not present

## 2022-03-02 MED ORDER — PIMECROLIMUS 1 % EX CREA: TOPICAL_CREAM | CUTANEOUS | 2 refills | Status: DC

## 2022-03-02 NOTE — Patient Instructions (Addendum)
Due to recent changes in healthcare laws, you may see results of your pathology and/or laboratory studies on MyChart before the doctors have had a chance to review them. We understand that in some cases there may be results that are confusing or concerning to you. Please understand that not all results are received at the same time and often the doctors may need to interpret multiple results in order to provide you with the best plan of care or course of treatment. Therefore, we ask that you please give us 2 business days to thoroughly review all your results before contacting the office for clarification. Should we see a critical lab result, you will be contacted sooner.   If You Need Anything After Your Visit  If you have any questions or concerns for your doctor, please call our main line at [phone] and press option 4 to reach your doctor's medical assistant. If no one answers, please leave a voicemail as directed and we will return your call as soon as possible. Messages left after 4 pm will be answered the following business day.   You may also send us a message via MyChart. We typically respond to MyChart messages within 1-2 business days.  For prescription refills, please ask your pharmacy to contact our office. Our fax number is [phone].  If you have an urgent issue when the clinic is closed that cannot wait until the next business day, you can page your doctor at the number below.    Please note that while we do our best to be available for urgent issues outside of office hours, we are not available 24/7.   If you have an urgent issue and are unable to reach us, you may choose to seek medical care at your doctor's office, retail clinic, urgent care center, or emergency room.  If you have a medical emergency, please immediately call 911 or go to the emergency department.  Pager Numbers  - Dr. Kowalski: [phone]  - Dr. Moye: [phone]  - Dr. Stewart:  [phone]  In the event of inclement weather, please call our main line at [phone] for an update on the status of any delays or closures.  Dermatology Medication Tips: Please keep the boxes that topical medications come in in order to help keep track of the instructions about where and how to use these. Pharmacies typically print the medication instructions only on the boxes and not directly on the medication tubes.   If your medication is too expensive, please contact our office at [phone] option 4 or send us a message through MyChart.   We are unable to tell what your co-pay for medications will be in advance as this is different depending on your insurance coverage. However, we may be able to find a substitute medication at lower cost or fill out paperwork to get insurance to cover a needed medication.   If a prior authorization is required to get your medication covered by your insurance company, please allow us 1-2 business days to complete this process.  Drug prices often vary depending on where the prescription is filled and some pharmacies may offer cheaper prices.  The website www.goodrx.com contains coupons for medications through different pharmacies. The prices here do not account for what the cost may be with help from insurance (it may be cheaper with your insurance), but the website can give you the price if you did not use any insurance.  - You can print the associated coupon and take it with   your prescription to the pharmacy.  - You may also stop by our office during regular business hours and pick up a GoodRx coupon card.  - If you need your prescription sent electronically to a different pharmacy, notify our office through [facility] MyChart or by phone at [phone] option 4.     Si Usted Necesita Algo Despus de Su Visita  Tambin puede enviarnos un mensaje a travs de MyChart. Por lo general respondemos a los mensajes de MyChart en el transcurso de 1 a 2  das hbiles.  Para renovar recetas, por favor pida a su farmacia que se ponga en contacto con nuestra oficina. Nuestro nmero de fax es el [phone].  Si tiene un asunto urgente cuando la clnica est cerrada y que no puede esperar hasta el siguiente da hbil, puede llamar/localizar a su doctor(a) al nmero que aparece a continuacin.   Por favor, tenga en cuenta que aunque hacemos todo lo posible para estar disponibles para asuntos urgentes fuera del horario de oficina, no estamos disponibles las 24 horas del da, los 7 das de la semana.   Si tiene un problema urgente y no puede comunicarse con nosotros, puede optar por buscar atencin mdica  en el consultorio de su doctor(a), en una clnica privada, en un centro de atencin urgente o en una sala de emergencias.  Si tiene una emergencia mdica, por favor llame inmediatamente al 911 o vaya a la sala de emergencias.  Nmeros de bper  - Dr. Kowalski: [phone]  - Dra. Moye: [phone]  - Dra. Stewart: [phone]  En caso de inclemencias del tiempo, por favor llame a nuestra lnea principal al [phone] para una actualizacin sobre el estado de cualquier retraso o cierre.  Consejos para la medicacin en dermatologa: Por favor, guarde las cajas en las que vienen los medicamentos de uso tpico para ayudarle a seguir las instrucciones sobre dnde y cmo usarlos. Las farmacias generalmente imprimen las instrucciones del medicamento slo en las cajas y no directamente en los tubos del medicamento.   Si su medicamento es muy caro, por favor, pngase en contacto con nuestra oficina llamando al [phone] y presione la opcin 4 o envenos un mensaje a travs de MyChart.   No podemos decirle cul ser su copago por los medicamentos por adelantado ya que esto es diferente dependiendo de la cobertura de su seguro. Sin embargo, es posible que podamos encontrar un medicamento sustituto a menor costo o llenar un formulario para que el  seguro cubra el medicamento que se considera necesario.   Si se requiere una autorizacin previa para que su compaa de seguros cubra su medicamento, por favor permtanos de 1 a 2 das hbiles para completar este proceso.  Los precios de los medicamentos varan con frecuencia dependiendo del lugar de dnde se surte la receta y alguna farmacias pueden ofrecer precios ms baratos.  El sitio web www.goodrx.com tiene cupones para medicamentos de diferentes farmacias. Los precios aqu no tienen en cuenta lo que podra costar con la ayuda del seguro (puede ser ms barato con su seguro), pero el sitio web puede darle el precio si no utiliz ningn seguro.  - Puede imprimir el cupn correspondiente y llevarlo con su receta a la farmacia.  - Tambin puede pasar por nuestra oficina durante el horario de atencin regular y recoger una tarjeta de cupones de GoodRx.  - Si necesita que su receta se enve electrnicamente a una farmacia diferente, informe a nuestra oficina a travs de MyChart de [facility]   o por telfono llamando al [phone] y presione la opcin 4.  

## 2022-03-02 NOTE — Progress Notes (Signed)
   Follow-Up Visit   Subjective  Heidi Richards is a 76 y.o. female who presents for the following: Rash (Patient here for follow up on rash. Reports since using opzelura and taking alergra 180 mg tab daily her itching has improved. ).  The following portions of the chart were reviewed this encounter and updated as appropriate:  Tobacco  Allergies  Meds  Problems  Med Hx  Surg Hx  Fam Hx     Review of Systems: No other skin or systemic complaints except as noted in HPI or Assessment and Plan.  Objective  Well appearing patient in no apparent distress; mood and affect are within normal limits.  A focused examination was performed including back and eyelids. Relevant physical exam findings are noted in the Assessment and Plan.   Assessment & Plan  Rash and other nonspecific skin eruption back T.R.U.E. Test read today day 8  Reaction to #28 Gold    T.R.U.E. Test     Row Name 03/02/22 1700           Location Back       Reading Interval Day 8       Panel Panel 1;Panel 2;Panel 3       1. Nickel Sulfate 0       28. Gold Sodium Thiosulfate 2               Patient given information at appointment about allergy and how to live with.  Recommended to stop wearing all gold jewelry   Discussed can take several months for skin to clear  Continue opzelura daily  Continue allegra 180 mg by mouth daily  Start Elidel 1 % cream - apply topically to affected areas of rash on body prn until clear.  pimecrolimus (ELIDEL) 1 % cream - back Apply topically to affected areas of rash on body bid prn until clear. Use when flared.  Return for keep follow up as scheduled . IRuthell Rummage, CMA, am acting as scribe for Sarina Ser, MD. Documentation: I have reviewed the above documentation for accuracy and completeness, and I agree with the above.  Sarina Ser, MD

## 2022-03-10 ENCOUNTER — Encounter: Payer: Self-pay | Admitting: Dermatology

## 2022-03-18 ENCOUNTER — Encounter: Payer: Self-pay | Admitting: Dermatology

## 2022-08-05 ENCOUNTER — Ambulatory Visit (INDEPENDENT_AMBULATORY_CARE_PROVIDER_SITE_OTHER): Payer: Medicare Other | Admitting: Dermatology

## 2022-08-05 VITALS — BP 149/72

## 2022-08-05 DIAGNOSIS — L578 Other skin changes due to chronic exposure to nonionizing radiation: Secondary | ICD-10-CM

## 2022-08-05 DIAGNOSIS — Z1283 Encounter for screening for malignant neoplasm of skin: Secondary | ICD-10-CM | POA: Diagnosis not present

## 2022-08-05 DIAGNOSIS — L219 Seborrheic dermatitis, unspecified: Secondary | ICD-10-CM

## 2022-08-05 DIAGNOSIS — L230 Allergic contact dermatitis due to metals: Secondary | ICD-10-CM

## 2022-08-05 DIAGNOSIS — D229 Melanocytic nevi, unspecified: Secondary | ICD-10-CM

## 2022-08-05 DIAGNOSIS — L2089 Other atopic dermatitis: Secondary | ICD-10-CM

## 2022-08-05 DIAGNOSIS — L814 Other melanin hyperpigmentation: Secondary | ICD-10-CM

## 2022-08-05 DIAGNOSIS — Z86018 Personal history of other benign neoplasm: Secondary | ICD-10-CM | POA: Diagnosis not present

## 2022-08-05 DIAGNOSIS — Z79899 Other long term (current) drug therapy: Secondary | ICD-10-CM

## 2022-08-05 DIAGNOSIS — Z7189 Other specified counseling: Secondary | ICD-10-CM

## 2022-08-05 DIAGNOSIS — I872 Venous insufficiency (chronic) (peripheral): Secondary | ICD-10-CM

## 2022-08-05 DIAGNOSIS — L821 Other seborrheic keratosis: Secondary | ICD-10-CM

## 2022-08-05 MED ORDER — TRIAMCINOLONE ACETONIDE 0.1 % EX CREA: 1.0000 | TOPICAL_CREAM | CUTANEOUS | 3 refills | Status: DC

## 2022-08-05 MED ORDER — PIMECROLIMUS 1 % EX CREA: TOPICAL_CREAM | CUTANEOUS | 11 refills | Status: DC

## 2022-08-05 MED ORDER — KETOCONAZOLE 2 % EX SHAM: 1.0000 | MEDICATED_SHAMPOO | CUTANEOUS | 11 refills | Status: DC

## 2022-08-05 NOTE — Patient Instructions (Addendum)
Allegra  take 1-4 pills a  day, in the morning as needed for itchin.   Due to recent changes in healthcare laws, you may see results of your pathology and/or laboratory studies on MyChart before the doctors have had a chance to review them. We understand that in some cases there may be results that are confusing or concerning to you. Please understand that not all results are received at the same time and often the doctors may need to interpret multiple results in order to provide you with the best plan of care or course of treatment. Therefore, we ask that you please give [place] 2 business days to thoroughly review all your results before contacting the office for clarification. Should we see a critical lab result, you will be contacted sooner.   If You Need Anything After Your Visit  If you have any questions or concerns for your doctor, please call our main line at [phone] and press option 4 to reach your doctor's medical assistant. If no one answers, please leave a voicemail as directed and we will return your call as soon as possible. Messages left after 4 pm will be answered the following business day.   You may also send [place] a message via MyChart. We typically respond to MyChart messages within 1-2 business days.  For prescription refills, please ask your pharmacy to contact our office. Our fax number is [phone].  If you have an urgent issue when the clinic is closed that cannot wait until the next business day, you can page your doctor at the number below.    Please note that while we do our best to be available for urgent issues outside of office hours, we are not available 24/7.   If you have an urgent issue and are unable to reach [place], you may choose to seek medical care at your doctor's office, retail clinic, urgent care center, or emergency room.  If you have a medical emergency, please immediately call 911 or go to the emergency department.  Pager Numbers  - Dr.  Gwen Pounds: [phone]  - Dr. Neale Burly: [phone]  - Dr. Roseanne Reno: [phone]  In the event of inclement weather, please call our main line at [phone] for an update on the status of any delays or closures.  Dermatology Medication Tips: Please keep the boxes that topical medications come in in order to help keep track of the instructions about where and how to use these. Pharmacies typically print the medication instructions only on the boxes and not directly on the medication tubes.   If your medication is too expensive, please contact our office at [phone] option 4 or send [place] a message through MyChart.   We are unable to tell what your co-pay for medications will be in advance as this is different depending on your insurance coverage. However, we may be able to find a substitute medication at lower cost or fill out paperwork to get insurance to cover a needed medication.   If a prior authorization is required to get your medication covered by your insurance company, please allow [place] 1-2 business days to complete this process.  Drug prices often vary depending on where the prescription is filled and some pharmacies may offer cheaper prices.  The website www.goodrx.com contains coupons for medications through different pharmacies. The prices here do not account for what the cost may be with help from insurance (it may be cheaper with your insurance), but the website can give you the price if you  did not use any insurance.  - You can print the associated coupon and take it with your prescription to the pharmacy.  - You may also stop by our office during regular business hours and pick up a GoodRx coupon card.  - If you need your prescription sent electronically to a different pharmacy, notify our office through [facility] or by phone at [phone] option 4.     Si Usted Necesita Algo Despus de Su Visita  Tambin puede enviarnos un mensaje a travs de Pharmacist, community. Por lo  general respondemos a los mensajes de MyChart en el transcurso de 1 a 2 das hbiles.  Para renovar recetas, por favor pida a su farmacia que se ponga en contacto con nuestra oficina. Harland Dingwall de fax es [place] [phone].  Si tiene un asunto urgente cuando la clnica est cerrada y que no puede esperar hasta el siguiente da hbil, puede llamar/localizar a su doctor(a) al nmero que aparece a continuacin.   Por favor, tenga en cuenta que aunque hacemos todo lo posible para estar disponibles para asuntos urgentes fuera del horario de [place], no estamos disponibles las 24 horas del da, los 7 das de la [place].   Si tiene un problema urgente y no puede comunicarse con nosotros, puede optar por buscar atencin mdica  en el consultorio de su doctor(a), en una clnica privada, en un centro de atencin urgente o en una sala de emergencias.  Si tiene Engineering geologist, por favor llame inmediatamente al 911 o vaya a la sala de emergencias.  Nmeros de bper  - Dr. Nehemiah Massed: [phone]  - Dra. Moye: [phone]  - Dra. Nicole Kindred: [phone]  En caso de inclemencias del [place], por favor llame a Johnsie Kindred principal al [phone] para una actualizacin sobre el [place] de cualquier retraso o cierre.  Consejos para la medicacin en dermatologa: Por favor, guarde las cajas en las que vienen los medicamentos de uso tpico para ayudarle a seguir las instrucciones sobre dnde y cmo usarlos. Las farmacias generalmente imprimen las instrucciones del medicamento slo en las cajas y no directamente en los tubos del [place].   Si su medicamento es muy caro, por favor, pngase en contacto con Zigmund Daniel llamando al [phone] y presione la opcin 4 o envenos un mensaje a travs de Pharmacist, community.   No podemos decirle cul ser su copago por los medicamentos por adelantado ya que esto es diferente dependiendo de la cobertura de su seguro. Sin embargo, es posible que podamos encontrar un  medicamento sustituto a Electrical engineer un formulario para que el seguro cubra el medicamento que se considera necesario.   Si se requiere una autorizacin previa para que su compaa de seguros [place] su medicamento, por favor permtanos de 1 a 2 das hbiles para completar este proceso.  Los precios de los medicamentos varan con frecuencia dependiendo del Environmental consultant de dnde se surte la receta y alguna farmacias pueden ofrecer precios ms baratos.  El sitio web www.goodrx.com tiene cupones para medicamentos de Airline pilot. Los precios aqu no tienen en cuenta lo que podra costar con la ayuda del seguro (puede ser ms barato con su seguro), pero el sitio web puede darle el precio si no utiliz Research scientist (physical sciences).  - Puede imprimir el cupn correspondiente y llevarlo con su receta a la farmacia.  - Tambin puede pasar por nuestra oficina durante el horario de atencin regular y Charity fundraiser una tarjeta de cupones de GoodRx.  - Si necesita que su receta se  enve electrnicamente a una farmacia diferente, informe a nuestra oficina a travs de MyChart de [place] o por telfono llamando al [phone] y presione la opcin 4.

## 2022-08-05 NOTE — Progress Notes (Signed)
Follow-Up Visit   Subjective  Heidi Richards is a 77 y.o. female who presents for the following: Skin Cancer Screening and Full Body Skin Exam, Eczema legs, Elidel bid, recent flare, pt used a new lotion and shaved and flared, hx of TRUE patch test show allergy to gold, pt has removed gold since 11/23 and still having pruritus scalp, and back using CLN shampoo qod, itching improved, but not clear, pt has red spot on back, has been there since patch testing, hx of Dysplastic Nevus The patient presents for Total-Body Skin Exam (TBSE) for skin cancer screening and mole check. The patient has spots, moles and lesions to be evaluated, some may be new or changing and the patient has concerns that these could be cancer.  The following portions of the chart were reviewed this encounter and updated as appropriate: medications, allergies, medical history  Review of Systems:  No other skin or systemic complaints except as noted in HPI or Assessment and Plan.  Objective  Well appearing patient in no apparent distress; mood and affect are within normal limits.  A full examination was performed including scalp, head, eyes, ears, nose, lips, neck, chest, axillae, abdomen, back, buttocks, bilateral upper extremities, bilateral lower extremities, hands, feet, fingers, toes, fingernails, and toenails. All findings within normal limits unless otherwise noted below.   Relevant physical exam findings are noted in the Assessment and Plan.   Assessment & Plan   LENTIGINES, SEBORRHEIC KERATOSES, HEMANGIOMAS - Benign normal skin lesions - Benign-appearing - Call for any changes  MELANOCYTIC NEVI - Tan-brown and/or pink-flesh-colored symmetric macules and papules - Benign appearing on exam today - Observation - Call clinic for new or changing moles - Recommend daily use of broad spectrum spf 30+ sunscreen to sun-exposed areas.   ACTINIC DAMAGE - Chronic condition, secondary to cumulative UV/sun exposure -  diffuse scaly erythematous macules with underlying dyspigmentation - Recommend daily broad spectrum sunscreen SPF 30+ to sun-exposed areas, reapply every 2 hours as needed.  - Staying in the shade or wearing long sleeves, sun glasses (UVA+UVB protection) and wide brim hats (4-inch brim around the entire circumference of the hat) are also recommended for sun protection.  - Call for new or changing lesions.  SKIN CANCER SCREENING PERFORMED TODAY.  HISTORY OF DYSPLASTIC NEVUS No evidence of recurrence today Recommend regular full body skin exams Recommend daily broad spectrum sunscreen SPF 30+ to sun-exposed areas, reapply every 2 hours as needed.  Call if any new or changing lesions are noted between office visits  - R post shoulder  ATOPIC DERMATITIS legs Exam: pink scaly patches legs Chronic and persistent condition with duration or expected duration over one year. Condition is bothersome/symptomatic for patient. Currently flared  Atopic dermatitis (eczema) is a chronic, relapsing, pruritic condition that can significantly affect quality of life. It is often associated with allergic rhinitis and/or asthma and can require treatment with topical medications, phototherapy, or in severe cases biologic injectable medication (Dupixent; Adbry) or Oral JAK inhibitors.  Treatment Plan: Start TMC 0.1% cr 3d/wk Friday, Saturday and Sundays to aa legs, avoid f/g/a Cont Elidel cr qd aa legs prn flares  Recommend gentle skin care.   Topical steroids (such as triamcinolone, fluocinolone, fluocinonide, mometasone, clobetasol, halobetasol, betamethasone, hydrocortisone) can cause thinning and lightening of the skin if they are used for too long in the same area. Your physician has selected the right strength medicine for your problem and area affected on the body. Please use your medication only as  directed by your physician to prevent side effects.    ALLERGIC CONTACT DERMATITIS Hx of TRUE Patch Test  with positive reaction to Gold Exam: pruritus scalp, back, pinkness scalp, dilated vessels Treatment Plan: Recommend avoid Gold Recommend pt have all plastic glasses.  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  It tends to be exacerbated by stress and cooler weather.  People who have neurologic disease may experience new onset or exacerbation of existing seborrheic dermatitis.  The condition is not curable but treatable and can be controlled.  Treatment Plan: Start Ketoconazole 2% shampoo q shampoo, let sit 5 minutes and rinse out   STASIS DERMATITIS Exam: Erythematous, scaly patches involving the ankle and distal lower leg with associated lower leg edema. Stasis in the legs causes chronic leg swelling, which may result in itchy or painful rashes, skin discoloration, skin texture changes, and sometimes ulceration.  Recommend daily graduated compression hose/stockings- easiest to put on first thing in morning, remove at bedtime.  Elevate legs as much as possible. Avoid salt/sodium rich foods. Treatment Plan: Recommend TMC 0.1% cr and Elidel cr as prescribed for Atopic Dermatitis     Return in about 1 year (around 08/05/2023) for TBSE.  I, Ardis Rowan, RMA, am acting as scribe for Heidi Sans, MD .  Documentation: I have reviewed the above documentation for accuracy and completeness, and I agree with the above.  Heidi Sans, MD

## 2022-08-11 ENCOUNTER — Encounter: Payer: Self-pay | Admitting: Dermatology

## 2022-09-08 ENCOUNTER — Other Ambulatory Visit: Payer: Self-pay | Admitting: Dermatology

## 2022-11-30 ENCOUNTER — Other Ambulatory Visit: Payer: Self-pay | Admitting: Internal Medicine

## 2022-11-30 DIAGNOSIS — Z1231 Encounter for screening mammogram for malignant neoplasm of breast: Secondary | ICD-10-CM

## 2023-01-03 NOTE — Progress Notes (Unsigned)
Cardiology Office Note  Date:  01/04/2023   ID:  AVANELLE RITCH, DOB 08-18-1945, MRN [id number]  PCP:  Barbette Reichmann, MD   Chief Complaint  Patient presents with   Follow-up    Last seen in 2002 for arrhythmia. Patient c/o bilateral LE edema, occasional twinges in chest, palpitations & shortness of breath with over exertion.     HPI:  Ms. Heidi Richards is a 77 year old woman with past medical history of Hypothyroidism Essential hypertension Diabetes type 2 Who presents for evaluation of chest twinges, palpitations, shortness of breath  Last seen by myself in clinic September 2022  In AM, short period of bradycardia, is asymptomatic Sometimes in the PM with bradycardia again asymptomatic Average heart rate appears in the 50s  Little leg swelling High water intake  Goes to the [facility] uses the treadmill, books on tape while exercising  Prior imaging reviewed 6/19 Minimal amount of bilateral atherosclerotic plaque   Was concerned that statins caused memory issues and statin held  Lab work reviewed Total cholesterol 200 LDL 119 off simvastatin 10 A1c 7.2  EKG personally reviewed by myself on todays visit EKG Interpretation Date/Time:  Monday January 04 2023 14:23:14 EDT Ventricular Rate:  68 PR Interval:  170 QRS Duration:  84 QT Interval:  368 QTC Calculation: 391 R Axis:   211  Text Interpretation: Sinus rhythm with marked sinus arrhythmia Anterolateral infarct , age undetermined No previous ECGs available Confirmed by Julien Nordmann [phone]) on 01/04/2023 2:26:26 PM    PMH:   has a past medical history of Bladder cancer (HCC) (2006), Carbon monoxide exposure, Dysplastic nevus (09/16/2009), History of elevated lipids, Hypothyroidism, Obesity, Personal history of chemotherapy (2006), PONV (postoperative nausea and vomiting), Pre-diabetes, SVT (supraventricular tachycardia), and Thyroid disease.  PSH:    Past Surgical History:  Procedure Laterality Date    BLADDER SURGERY     COLONOSCOPY WITH PROPOFOL N/A 06/21/2017   Procedure: COLONOSCOPY WITH PROPOFOL;  Surgeon: Christena Deem, MD;  Location: [facility] ENDOSCOPY;  Service: Endoscopy;  Laterality: N/A;   COLONOSCOPY WITH PROPOFOL N/A 08/24/2017   Procedure: COLONOSCOPY WITH PROPOFOL;  Surgeon: Christena Deem, MD;  Location[facility] ENDOSCOPY;  Service: Endoscopy;  Laterality: N/A;   EYE SURGERY  04/12/2017   detattached retina    Current Outpatient Medications  Medication Sig Dispense Refill   amLODipine (NORVASC) 2.5 MG tablet Take 1 tablet (2.5 mg) by mouth once daily at night     aspirin 81 MG EC tablet Take 81 mg by mouth daily.     calcium carbonate (TUMS EX) 750 MG chewable tablet Chew 1 tablet by mouth daily.     fexofenadine (ALLEGRA) 180 MG tablet Take 180 mg by mouth daily.     furosemide (LASIX) 20 MG tablet Take 1 tablet (20 mg total) by mouth daily as needed. 30 tablet 3   hydrochlorothiazide (HYDRODIURIL) 12.5 MG tablet Take 12.5 mg by mouth daily.     ketoconazole (NIZORAL) 2 % shampoo Apply 1 Application topically as directed. Wash scalp, let sit 5 minutes and rinse out 120 mL 11   levothyroxine (SYNTHROID, LEVOTHROID) 88 MCG tablet Take 88 mcg by mouth daily.     losartan (COZAAR) 100 MG tablet Take 1 tablet by mouth daily.     pimecrolimus (ELIDEL) 1 % cream APPLY TO AFFECTED AREA UNTIL ALL CLEAR 30 g 2   Probiotic Product (ALIGN) 4 MG CAPS Take by mouth as needed.     triamcinolone cream (KENALOG) 0.1 % Apply 1  Application topically as directed. Apply to lower legs once daily on Friday, Saturday and Sunday as needed for flares, avoid face, groin, axilla 80 g 3   No current facility-administered medications for this visit.    Allergies:   Ciprofloxacin, Fioricet [butalbital-apap-caffeine], Misc. sulfonamide containing compounds, and Sulfa antibiotics   Social History:  The patient  reports that she quit smoking about 37 years ago. Her smoking use included cigarettes. She  has never used smokeless tobacco. She reports current alcohol use. She reports that she does not use drugs.   Family History:   family history includes Breast cancer (age of onset: 38) in her sister; Cancer in her brother, father, mother, sister, sister, and sister.    Review of Systems: Review of Systems  Constitutional: Negative.   HENT: Negative.    Respiratory: Negative.    Cardiovascular:  Positive for palpitations and leg swelling.  Gastrointestinal: Negative.   Musculoskeletal: Negative.   Neurological: Negative.   Psychiatric/Behavioral: Negative.    All other systems reviewed and are negative.   PHYSICAL EXAM: VS:  BP (!) 140/60 (BP Location: Left Arm, Patient Position: Sitting, Cuff Size: Large)   Pulse 68   Ht 5' 3.5" (1.613 m)   Wt 202 lb 4 oz (91.7 kg)   SpO2 97%   BMI 35.26 kg/m  , BMI Body mass index is 35.26 kg/m. GEN: Well nourished, well developed, in no acute distress HEENT: normal Neck: no JVD, carotid bruits, or masses Cardiac: RRR; no murmurs, rubs, or gallops, trace ankle edema  Respiratory:  clear to auscultation bilaterally, normal work of breathing GI: soft, nontender, nondistended, + BS MS: no deformity or atrophy Skin: warm and dry, no rash Neuro:  Strength and sensation are intact Psych: euthymic mood, full affect   Recent Labs: No results found for requested labs within last 365 days.    Lipid Panel No results found for: "CHOL", "HDL", "LDLCALC", "TRIG"    Wt Readings from Last 3 Encounters:  01/04/23 202 lb 4 oz (91.7 kg)  01/10/21 199 lb 4 oz (90.4 kg)  08/24/17 187 lb (84.8 kg)     ASSESSMENT AND PLAN:  Problem List Items Addressed This Visit   None Visit Diagnoses     Palpitations    -  Primary   Shortness of breath       Bradycardia       Relevant Orders   EKG 12-Lead (Completed)   Sinus arrhythmia       Relevant Medications   losartan (COZAAR) 100 MG tablet   hydrochlorothiazide (HYDRODIURIL) 12.5 MG tablet    furosemide (LASIX) 20 MG tablet   Other Relevant Orders   EKG 12-Lead (Completed)   Benign essential HTN       Relevant Medications   losartan (COZAAR) 100 MG tablet   hydrochlorothiazide (HYDRODIURIL) 12.5 MG tablet   furosemide (LASIX) 20 MG tablet   Other Relevant Orders   EKG 12-Lead (Completed)       Sinus bradycardia Sinus arrhythmia noted on EKG today likely explaining bradycardia Asymptomatic Avoid beta-blockers given chronic baseline bradycardia  Sinus arrhythmia Asymptomatic, variable heart rate on EKG likely explaining periods of bradycardia on her watch measurements EKG discussed in detail Repeat Zio monitor could be ordered for symptoms  Essential hypertension Blood pressure is well controlled on today's visit. No changes made to the medications.  Leg swelling Recommend she try Lasix 20 mg daily as needed, might need 1 or 2/week Leg elevation with compression hose If symptoms do  not improve, potentially could hold the amlodipine  Hyperlipidemia Previously on simvastatin 10 but stopped this secondary to concern for memory loss Will start Zetia 10 mg daily Given history of diabetes, will push for lower LDL preferably less than 100    Total encounter time more than 40 minutes  Greater than 50% was spent in counseling and coordination of care with the patient    Signed, Dossie Arbour, M.D., Ph.D. [facility] [place], [place] [phone]

## 2023-01-04 ENCOUNTER — Ambulatory Visit: Payer: Medicare Other | Attending: Cardiovascular Disease | Admitting: Cardiovascular Disease

## 2023-01-04 ENCOUNTER — Encounter: Payer: Self-pay | Admitting: Cardiovascular Disease

## 2023-01-04 VITALS — BP 140/60 | HR 68 | Ht 63.5 in | Wt 202.2 lb

## 2023-01-04 DIAGNOSIS — R001 Bradycardia, unspecified: Secondary | ICD-10-CM | POA: Diagnosis not present

## 2023-01-04 DIAGNOSIS — I10 Essential (primary) hypertension: Secondary | ICD-10-CM | POA: Diagnosis present

## 2023-01-04 DIAGNOSIS — R0602 Shortness of breath: Secondary | ICD-10-CM | POA: Diagnosis not present

## 2023-01-04 DIAGNOSIS — I498 Other specified cardiac arrhythmias: Secondary | ICD-10-CM | POA: Diagnosis not present

## 2023-01-04 DIAGNOSIS — R002 Palpitations: Secondary | ICD-10-CM | POA: Diagnosis not present

## 2023-01-04 MED ORDER — EZETIMIBE 10 MG PO TABS: 10.0000 mg | ORAL_TABLET | Freq: Every day | ORAL | 3 refills | Status: DC

## 2023-01-04 MED ORDER — FUROSEMIDE 20 MG PO TABS: 20.0000 mg | ORAL_TABLET | Freq: Every day | ORAL | 3 refills | Status: DC | PRN

## 2023-01-04 NOTE — Patient Instructions (Addendum)
Medication Instructions:  Lasix /furosemide 20 mg daily as needed  Start zetia 10 mg daily  If you need a refill on your cardiac medications before your next appointment, please call your pharmacy.   Lab work: No new labs needed  Testing/Procedures: No new testing needed  Follow-Up: At [facility], you and your health needs are our priority.  As part of our continuing mission to provide you with exceptional heart care, we have created designated Provider Care Teams.  These Care Teams include your primary Cardiologist (physician) and Advanced Practice Providers (APPs -  Physician Assistants and Nurse Practitioners) who all work together to provide you with the care you need, when you need it.  You will need a follow up appointment as needed  Providers on your designated Care Team:   Nicolasa Ducking, NP Eula Listen, PA-C Cadence Fransico Michael, [place]  COVID-19 Vaccine Information can be found at: [email or web address] For questions related to vaccine distribution or appointments, please email [email or web address] or call [phone].

## 2023-01-05 ENCOUNTER — Ambulatory Visit
Admission: RE | Admit: 2023-01-05 | Discharge: 2023-01-05 | Disposition: A | Discharge status: Home / Self Care | Payer: Medicare Other | Source: Ambulatory Visit | Attending: Internal Medicine | Admitting: Internal Medicine

## 2023-01-05 DIAGNOSIS — Z1231 Encounter for screening mammogram for malignant neoplasm of breast: Secondary | ICD-10-CM | POA: Insufficient documentation

## 2023-04-02 ENCOUNTER — Other Ambulatory Visit: Payer: Self-pay | Admitting: Cardiovascular Disease

## 2023-05-25 ENCOUNTER — Encounter: Payer: Self-pay | Admitting: *Deleted

## 2023-05-27 ENCOUNTER — Encounter: Payer: Self-pay | Admitting: *Deleted

## 2023-05-28 ENCOUNTER — Encounter: Payer: Self-pay | Admitting: *Deleted

## 2023-06-03 ENCOUNTER — Encounter: Payer: Self-pay | Admitting: *Deleted

## 2023-06-04 ENCOUNTER — Ambulatory Visit: Payer: Medicare Other | Admitting: General Practice

## 2023-06-04 ENCOUNTER — Other Ambulatory Visit: Payer: Self-pay

## 2023-06-04 ENCOUNTER — Encounter
Admission: RE | Disposition: A | Discharge status: Home / Self Care | Payer: Self-pay | Source: Home / Self Care | Attending: Gastroenterology

## 2023-06-04 ENCOUNTER — Ambulatory Visit
Admission: RE | Admit: 2023-06-04 | Discharge: 2023-06-04 | Disposition: A | Discharge status: Home / Self Care | Payer: Medicare Other | Attending: Gastroenterology | Admitting: Gastroenterology

## 2023-06-04 ENCOUNTER — Encounter: Payer: Self-pay | Admitting: *Deleted

## 2023-06-04 DIAGNOSIS — Z1211 Encounter for screening for malignant neoplasm of colon: Secondary | ICD-10-CM | POA: Diagnosis not present

## 2023-06-04 DIAGNOSIS — G4730 Sleep apnea, unspecified: Secondary | ICD-10-CM | POA: Insufficient documentation

## 2023-06-04 DIAGNOSIS — E119 Type 2 diabetes mellitus without complications: Secondary | ICD-10-CM | POA: Diagnosis not present

## 2023-06-04 DIAGNOSIS — Z800 Family history of malignant neoplasm of digestive organs: Secondary | ICD-10-CM | POA: Diagnosis present

## 2023-06-04 DIAGNOSIS — I471 Supraventricular tachycardia, unspecified: Secondary | ICD-10-CM | POA: Diagnosis not present

## 2023-06-04 DIAGNOSIS — I252 Old myocardial infarction: Secondary | ICD-10-CM | POA: Diagnosis not present

## 2023-06-04 DIAGNOSIS — K5730 Diverticulosis of large intestine without perforation or abscess without bleeding: Secondary | ICD-10-CM | POA: Insufficient documentation

## 2023-06-04 DIAGNOSIS — I10 Essential (primary) hypertension: Secondary | ICD-10-CM | POA: Diagnosis not present

## 2023-06-04 DIAGNOSIS — Z87891 Personal history of nicotine dependence: Secondary | ICD-10-CM | POA: Diagnosis not present

## 2023-06-04 DIAGNOSIS — Z8551 Personal history of malignant neoplasm of bladder: Secondary | ICD-10-CM | POA: Insufficient documentation

## 2023-06-04 DIAGNOSIS — E039 Hypothyroidism, unspecified: Secondary | ICD-10-CM | POA: Insufficient documentation

## 2023-06-04 DIAGNOSIS — K640 First degree hemorrhoids: Secondary | ICD-10-CM | POA: Insufficient documentation

## 2023-06-04 DIAGNOSIS — Z79899 Other long term (current) drug therapy: Secondary | ICD-10-CM | POA: Insufficient documentation

## 2023-06-04 HISTORY — PX: COLONOSCOPY WITH PROPOFOL: SHX5780

## 2023-06-04 SURGERY — COLONOSCOPY WITH PROPOFOL
Anesthesia: General

## 2023-06-04 MED ORDER — PROPOFOL 10 MG/ML IV BOLUS
INTRAVENOUS | Status: AC
  Filled 2023-06-04: qty 40

## 2023-06-04 MED ORDER — PROPOFOL 500 MG/50ML IV EMUL
INTRAVENOUS | Status: DC | PRN
  Administered 2023-06-04: 100 ug/kg/min via INTRAVENOUS
  Administered 2023-06-04: 50 mg via INTRAVENOUS
  Administered 2023-06-04: 25 mg via INTRAVENOUS
  Administered 2023-06-04: 75 mg via INTRAVENOUS

## 2023-06-04 MED ORDER — LIDOCAINE HCL (CARDIAC) PF 100 MG/5ML IV SOSY
PREFILLED_SYRINGE | INTRAVENOUS | Status: DC | PRN
  Administered 2023-06-04: 70 mg via INTRAVENOUS

## 2023-06-04 MED ORDER — SODIUM CHLORIDE 0.9 % IV SOLN: INTRAVENOUS | Status: DC

## 2023-06-04 MED ORDER — LIDOCAINE HCL (PF) 2 % IJ SOLN
INTRAMUSCULAR | Status: AC
  Filled 2023-06-04: qty 5

## 2023-06-04 NOTE — Transfer of Care (Signed)
Immediate Anesthesia Transfer of Care Note  Patient: Heidi Richards  Procedure(s) Performed: COLONOSCOPY WITH PROPOFOL  Patient Location: Endoscopy Unit  Anesthesia Type:General  Level of Consciousness: drowsy  Airway & Oxygen Therapy: Patient Spontanous Breathing  Post-op Assessment: Report given to RN  Post vital signs: stable  Last Vitals:  Vitals Value Taken Time  BP    Temp    Pulse    Resp    SpO2      Last Pain:  Vitals:   06/04/23 0754  TempSrc: Temporal  PainSc: 0-No pain         Complications: No notable events documented.

## 2023-06-04 NOTE — Anesthesia Postprocedure Evaluation (Signed)
Anesthesia Post Note  Patient: Heidi Richards  Procedure(s) Performed: COLONOSCOPY WITH PROPOFOL  Patient location during evaluation: Endoscopy Anesthesia Type: General Level of consciousness: awake and alert Pain management: pain level controlled Vital Signs Assessment: post-procedure vital signs reviewed and stable Respiratory status: spontaneous breathing, nonlabored ventilation, respiratory function stable and patient connected to nasal cannula oxygen Cardiovascular status: blood pressure returned to baseline and stable Postop Assessment: no apparent nausea or vomiting Anesthetic complications: no  No notable events documented.   Last Vitals:  Vitals:   06/04/23 0856 06/04/23 0906  BP: (!) 99/47 104/62  Pulse: (!) 52 (!) 46  Resp: 15 12  Temp:    SpO2: 93% 99%    Last Pain:  Vitals:   06/04/23 0906  TempSrc:   PainSc: 0-No pain                 Stephanie Coup

## 2023-06-04 NOTE — Interval H&P Note (Signed)
History and Physical Interval Note:  06/04/2023 8:29 AM  Heidi Richards  has presented today for surgery, with the diagnosis of family hx of CCA.  The various methods of treatment have been discussed with the patient and family. After consideration of risks, benefits and other options for treatment, the patient has consented to  Procedure(s): COLONOSCOPY WITH PROPOFOL (N/A) as a surgical intervention.  The patient's history has been reviewed, patient examined, no change in status, stable for surgery.  I have reviewed the patient's chart and labs.  Questions were answered to the patient's satisfaction.     Regis Bill  Ok to proceed with colonoscopy

## 2023-06-04 NOTE — H&P (Signed)
Outpatient short stay form Pre-procedure 06/04/2023  Regis Bill, MD  Primary Physician: Barbette Reichmann, MD  Reason for visit:  Screening  History of present illness:    78 y/o lady with history of hypertension, hypothyroidism, and bladder cancer here for screening colonoscopy due to family history of colon cancer. Her brother and father had colon cancer in their 2's and 47's. No blood thinners except aspirin. No significant abdominal surgeries.    Current Facility-Administered Medications:    0.9 %  sodium chloride infusion, , Intravenous, Continuous, Jaston Havens, Rossie Muskrat, MD, Last Rate: 20 mL/hr at 06/04/23 0803, New Bag at 06/04/23 0803  Medications Prior to Admission  Medication Sig Dispense Refill Last Dose/Taking   amLODipine (NORVASC) 2.5 MG tablet Take 1 tablet (2.5 mg) by mouth once daily at night   06/03/2023   calcium carbonate (TUMS EX) 750 MG chewable tablet Chew 1 tablet by mouth daily.   Past Month   ezetimibe (ZETIA) 10 MG tablet Take 1 tablet (10 mg total) by mouth daily. 90 tablet 3 06/03/2023   fexofenadine (ALLEGRA) 180 MG tablet Take 180 mg by mouth daily.   Past Month   hydrochlorothiazide (HYDRODIURIL) 12.5 MG tablet Take 12.5 mg by mouth daily.   06/04/2023 at  7:00 AM   levothyroxine (SYNTHROID, LEVOTHROID) 88 MCG tablet Take 88 mcg by mouth daily.   06/04/2023 at  6:00 AM   losartan (COZAAR) 100 MG tablet Take 1 tablet by mouth daily.   06/04/2023 at  7:00 AM   Multiple Minerals-Vitamins (CALCIUM & VIT D3 BONE HEALTH PO) Take by mouth.   05/29/2023   aspirin 81 MG EC tablet Take 81 mg by mouth daily.   06/02/2023   furosemide (LASIX) 20 MG tablet TAKE 1 TABLET BY MOUTH EVERY DAY AS NEEDED (Patient not taking: Reported on 05/28/2023) 90 tablet 1 Not Taking   ketoconazole (NIZORAL) 2 % shampoo Apply 1 Application topically as directed. Wash scalp, let sit 5 minutes and rinse out 120 mL 11    pimecrolimus (ELIDEL) 1 % cream APPLY TO AFFECTED AREA UNTIL ALL CLEAR  30 g 2    Probiotic Product (ALIGN) 4 MG CAPS Take by mouth as needed.      triamcinolone cream (KENALOG) 0.1 % Apply 1 Application topically as directed. Apply to lower legs once daily on Friday, Saturday and Sunday as needed for flares, avoid face, groin, axilla 80 g 3      Allergies  Allergen Reactions   Ciprofloxacin Swelling and Other (See Comments)   Fioricet [Butalbital-Apap-Caffeine]    Gold-Containing Drug Products Dermatitis   Misc. Sulfonamide Containing Compounds Other (See Comments)   Sulfa Antibiotics Nausea Only and Other (See Comments)     Past Medical History:  Diagnosis Date   Bladder cancer (HCC) 2006   Carbon monoxide exposure    Dysplastic nevus 09/16/2009   Right post. shoulder. Severe atypia, close to margin. Excised 10/09/2009, margins free.   History of elevated lipids    Hypothyroidism    Obesity    Personal history of chemotherapy 2006   PONV (postoperative nausea and vomiting)    nausea and vomiting   Pre-diabetes    SVT (supraventricular tachycardia) (HCC)    Thyroid disease    hypothyroid'    Review of systems:  Otherwise negative.    Physical Exam  Gen: Alert, oriented. Appears stated age.  HEENT: PERRLA. Lungs: No respiratory distress CV: RRR Abd: soft, benign, no masses Ext: No edema    Planned procedures:  Proceed with colonoscopy. The patient understands the nature of the planned procedure, indications, risks, alternatives and potential complications including but not limited to bleeding, infection, perforation, damage to internal organs and possible oversedation/side effects from anesthesia. The patient agrees and gives consent to proceed.  Please refer to procedure notes for findings, recommendations and patient disposition/instructions.     Regis Bill, MD [facility]

## 2023-06-04 NOTE — Anesthesia Preprocedure Evaluation (Signed)
Anesthesia Evaluation  Patient identified by MRN, date of birth, ID band Patient awake    Reviewed: Allergy & Precautions, H&P , NPO status , Patient's Chart, lab work & pertinent test results, reviewed documented beta blocker date and time   History of Anesthesia Complications (+) PONV and history of anesthetic complications  Airway Mallampati: II  TM Distance: <3 FB Neck ROM: limited    Dental  (+) Chipped, Dental Advidsory Given   Pulmonary neg shortness of breath, sleep apnea , former smoker   Pulmonary exam normal        Cardiovascular Exercise Tolerance: Good hypertension, Pt. on medications and Pt. on home beta blockers (-) angina + Past MI  Normal cardiovascular exam+ dysrhythmias Supra Ventricular Tachycardia      Neuro/Psych negative neurological ROS  negative psych ROS   GI/Hepatic Neg liver ROS,GERD  Medicated and Controlled,,  Endo/Other  diabetes, Type 2Hypothyroidism    Renal/GU      Musculoskeletal   Abdominal   Peds  Hematology negative hematology ROS (+)   Anesthesia Other Findings Past Medical History: No date: Bladder cancer (HCC) 2006: Cancer (HCC) No date: Carbon monoxide exposure No date: History of elevated lipids No date: Hypothyroidism No date: Obesity No date: PONV (postoperative nausea and vomiting)     Comment:  nausea and vomiting No date: Pre-diabetes No date: SVT (supraventricular tachycardia) (HCC) No date: Thyroid disease     Comment:  hypothyroid'  Past Surgical History: No date: BLADDER SURGERY 04/12/2017: EYE SURGERY     Comment:  detattached retina  BMI    Body Mass Index:  33.29 kg/m      Reproductive/Obstetrics negative OB ROS                             Anesthesia Physical Anesthesia Plan  ASA: 3  Anesthesia Plan: General   Post-op Pain Management: Minimal or no pain anticipated   Induction: Intravenous  PONV Risk Score  and Plan: 3 and Propofol infusion, TIVA and Ondansetron  Airway Management Planned: Nasal Cannula  Additional Equipment: None  Intra-op Plan:   Post-operative Plan:   Informed Consent: I have reviewed the patients History and Physical, chart, labs and discussed the procedure including the risks, benefits and alternatives for the proposed anesthesia with the patient or authorized representative who has indicated his/her understanding and acceptance.     Dental advisory given  Plan Discussed with: CRNA and Surgeon  Anesthesia Plan Comments: (Discussed risks of anesthesia with patient, including possibility of difficulty with spontaneous ventilation under anesthesia necessitating airway intervention, PONV, and rare risks such as cardiac or respiratory or neurological events, and allergic reactions. Discussed the role of CRNA in patient's perioperative care. Patient understands.)       Anesthesia Quick Evaluation

## 2023-06-04 NOTE — Op Note (Signed)
[facility] Patient Name: Heidi Richards Procedure Date: 06/04/2023 8:17 AM MRN: [id number] Account #: [id number] Date of Birth: Jan 29, 1946 Admit Type: Outpatient Age: 78 Room: [facility] ENDO ROOM 3 Gender: Female Note Status: Finalized Instrument Name: Nelda Marseille [id number] Procedure:             Colonoscopy Indications:           Screening patient at increased risk: Family history of                         colorectal cancer in multiple 1st-degree relatives Providers:             Eather Colas MD, MD Referring MD:          Barbette Reichmann, MD (Referring MD) Medicines:             Monitored Anesthesia Care Complications:         No immediate complications. Procedure:             Pre-Anesthesia Assessment:                        - Prior to the procedure, a History and Physical was                         performed, and patient medications and allergies were                         reviewed. The patient is competent. The risks and                         benefits of the procedure and the sedation options and                         risks were discussed with the patient. All questions                         were answered and informed consent was obtained.                         Patient identification and proposed procedure were                         verified by the physician, the nurse, the                         anesthesiologist, the anesthetist and the technician                         in the endoscopy suite. Mental Status Examination:                         alert and oriented. Airway Examination: normal                         oropharyngeal airway and neck mobility. Respiratory                         Examination: clear to auscultation. CV Examination:  normal. Prophylactic Antibiotics: The patient does not                         require prophylactic antibiotics. Prior                         Anticoagulants: The patient  has taken no anticoagulant                         or antiplatelet agents. ASA Grade Assessment: III - A                         patient with severe systemic disease. After reviewing                         the risks and benefits, the patient was deemed in                         satisfactory condition to undergo the procedure. The                         anesthesia plan was to use monitored anesthesia care                         (MAC). Immediately prior to administration of                         medications, the patient was re-assessed for adequacy                         to receive sedatives. The heart rate, respiratory                         rate, oxygen saturations, blood pressure, adequacy of                         pulmonary ventilation, and response to care were                         monitored throughout the procedure. The physical                         status of the patient was re-assessed after the                         procedure.                        After obtaining informed consent, the colonoscope was                         passed under direct vision. Throughout the procedure,                         the patient's blood pressure, pulse, and oxygen                         saturations were monitored continuously. The  Colonoscope was introduced through the anus and                         advanced to the the cecum, identified by appendiceal                         orifice and ileocecal valve. The colonoscopy was                         performed without difficulty. The patient tolerated                         the procedure well. The quality of the bowel                         preparation was good. The ileocecal valve, appendiceal                         orifice, and rectum were photographed. Findings:      The perianal and digital rectal examinations were normal.      A few small-mouthed diverticula were found in the sigmoid colon.       Internal hemorrhoids were found during retroflexion. The hemorrhoids       were Grade I (internal hemorrhoids that do not prolapse).      The exam was otherwise without abnormality on direct and retroflexion       views. Impression:            - Diverticulosis in the sigmoid colon.                        - Internal hemorrhoids.                        - The examination was otherwise normal on direct and                         retroflexion views.                        - No specimens collected. Recommendation:        - Discharge patient to home.                        - Resume previous diet.                        - Continue present medications.                        - Repeat colonoscopy is not recommended due to current                         age (2 years or older) for surveillance.                        - Return to referring physician as previously                         scheduled. Procedure Code(s):     --- Professional ---  G0105, Colorectal cancer screening; colonoscopy on                         individual at high risk Diagnosis Code(s):     --- Professional ---                        Z80.0, Family history of malignant neoplasm of                         digestive organs                        K64.0, First degree hemorrhoids                        K57.30, Diverticulosis of large intestine without                         perforation or abscess without bleeding CPT copyright 2022 American Medical Association. All rights reserved. The codes documented in this report are preliminary and upon coder review may  be revised to meet current compliance requirements. Eather Colas MD, MD 06/04/2023 8:57:11 AM Number of Addenda: 0 Note Initiated On: 06/04/2023 8:17 AM Scope Withdrawal Time: 0 hours 9 minutes 8 seconds  Total Procedure Duration: 0 hours 15 minutes 56 seconds  Estimated Blood Loss:  Estimated blood loss: none.      [facility]

## 2023-06-05 NOTE — Progress Notes (Signed)
Voicemail.  No Message Left. 

## 2023-06-07 ENCOUNTER — Encounter: Payer: Self-pay | Admitting: Gastroenterology

## 2023-08-05 ENCOUNTER — Encounter: Payer: Self-pay | Admitting: Dermatology

## 2023-08-05 ENCOUNTER — Telehealth: Payer: Self-pay

## 2023-08-05 ENCOUNTER — Ambulatory Visit: Payer: Medicare Other | Admitting: Dermatology

## 2023-08-05 DIAGNOSIS — L814 Other melanin hyperpigmentation: Secondary | ICD-10-CM

## 2023-08-05 DIAGNOSIS — I781 Nevus, non-neoplastic: Secondary | ICD-10-CM

## 2023-08-05 DIAGNOSIS — Z7189 Other specified counseling: Secondary | ICD-10-CM

## 2023-08-05 DIAGNOSIS — Z86018 Personal history of other benign neoplasm: Secondary | ICD-10-CM

## 2023-08-05 DIAGNOSIS — L209 Atopic dermatitis, unspecified: Secondary | ICD-10-CM

## 2023-08-05 DIAGNOSIS — L219 Seborrheic dermatitis, unspecified: Secondary | ICD-10-CM | POA: Diagnosis not present

## 2023-08-05 DIAGNOSIS — L578 Other skin changes due to chronic exposure to nonionizing radiation: Secondary | ICD-10-CM

## 2023-08-05 DIAGNOSIS — I8393 Asymptomatic varicose veins of bilateral lower extremities: Secondary | ICD-10-CM

## 2023-08-05 DIAGNOSIS — D692 Other nonthrombocytopenic purpura: Secondary | ICD-10-CM

## 2023-08-05 DIAGNOSIS — L230 Allergic contact dermatitis due to metals: Secondary | ICD-10-CM

## 2023-08-05 DIAGNOSIS — Z79899 Other long term (current) drug therapy: Secondary | ICD-10-CM

## 2023-08-05 DIAGNOSIS — W908XXA Exposure to other nonionizing radiation, initial encounter: Secondary | ICD-10-CM

## 2023-08-05 DIAGNOSIS — D229 Melanocytic nevi, unspecified: Secondary | ICD-10-CM

## 2023-08-05 DIAGNOSIS — L719 Rosacea, unspecified: Secondary | ICD-10-CM

## 2023-08-05 DIAGNOSIS — L820 Inflamed seborrheic keratosis: Secondary | ICD-10-CM

## 2023-08-05 DIAGNOSIS — L2089 Other atopic dermatitis: Secondary | ICD-10-CM

## 2023-08-05 DIAGNOSIS — Z1283 Encounter for screening for malignant neoplasm of skin: Secondary | ICD-10-CM | POA: Diagnosis not present

## 2023-08-05 DIAGNOSIS — L821 Other seborrheic keratosis: Secondary | ICD-10-CM

## 2023-08-05 DIAGNOSIS — D1801 Hemangioma of skin and subcutaneous tissue: Secondary | ICD-10-CM

## 2023-08-05 MED ORDER — TRIAMCINOLONE ACETONIDE 0.1 % EX CREA: 1.0000 | TOPICAL_CREAM | CUTANEOUS | 3 refills | Status: AC

## 2023-08-05 MED ORDER — PIMECROLIMUS 1 % EX CREA: TOPICAL_CREAM | CUTANEOUS | 6 refills | Status: DC

## 2023-08-05 MED ORDER — KETOCONAZOLE 2 % EX SHAM: 1.0000 | MEDICATED_SHAMPOO | CUTANEOUS | 11 refills | Status: AC

## 2023-08-05 NOTE — Telephone Encounter (Signed)
 Patient left a voicemail that the medication that she uses anywhere on the body and face is Pimecrolimus  and the cream that she uses on her legs is Triamcinolone .

## 2023-08-05 NOTE — Telephone Encounter (Signed)
 Patient was notified rx for pimecrolimus  had been sent to her pharmacy. Will document in records that she is using this cream and Triamcinolone  on her legs. Patient states she did not need refills right now but told pharmacy to put on hold and she will refill when needed.

## 2023-08-05 NOTE — Progress Notes (Signed)
 Follow-Up Visit   Subjective  Heidi Richards is a 78 y.o. female who presents for the following: Skin Cancer Screening and Full Body Skin Exam Spot at right lower leg she noticed in last year she would like checked/ Hx of dysplastic nevi, hx of atopic dermatitis   The patient presents for Total-Body Skin Exam (TBSE) for skin cancer screening and mole check. The patient has spots, moles and lesions to be evaluated, some may be new or changing and the patient may have concern these could be cancer.  The following portions of the chart were reviewed this encounter and updated as appropriate: medications, allergies, medical history  Review of Systems:  No other skin or systemic complaints except as noted in HPI or Assessment and Plan.  Objective  Well appearing patient in no apparent distress; mood and affect are within normal limits.  A full examination was performed including scalp, head, eyes, ears, nose, lips, neck, chest, axillae, abdomen, back, buttocks, bilateral upper extremities, bilateral lower extremities, hands, feet, fingers, toes, fingernails, and toenails. All findings within normal limits unless otherwise noted below.   Relevant physical exam findings are noted in the Assessment and Plan.  upper back spinal x 3, right forearm x 2, right lower leg pretibial x 1, right mandible x 2, right neck x 3, left shoulder x 4, left neck x 4 (19) Erythematous stuck-on, waxy papule or plaque  Assessment & Plan   SKIN CANCER SCREENING PERFORMED TODAY.  ACTINIC DAMAGE - Chronic condition, secondary to cumulative UV/sun exposure - diffuse scaly erythematous macules with underlying dyspigmentation - Recommend daily broad spectrum sunscreen SPF 30+ to sun-exposed areas, reapply every 2 hours as needed.  - Staying in the shade or wearing long sleeves, sun glasses (UVA+UVB protection) and wide brim hats (4-inch brim around the entire circumference of the hat) are also recommended for sun  protection.  - Call for new or changing lesions.  LENTIGINES, SEBORRHEIC KERATOSES, HEMANGIOMAS - Benign normal skin lesions - Benign-appearing - Call for any changes  MELANOCYTIC NEVI - Tan-brown and/or pink-flesh-colored symmetric macules and papules - Benign appearing on exam today - Observation - Call clinic for new or changing moles - Recommend daily use of broad spectrum spf 30+ sunscreen to sun-exposed areas.   Varicose Veins/Spider Veins - Dilated blue, purple or red veins at the lower extremities - Reassured - Smaller vessels can be treated by sclerotherapy (a procedure to inject a medicine into the veins to make them disappear) if desired, but the treatment is not covered by insurance. Larger vessels may be covered if symptomatic and we would refer to vascular surgeon if treatment desired.  Purpura - Chronic; persistent and recurrent.  Treatable, but not curable. - Violaceous macules and patches - Benign - Related to trauma, age, sun damage and/or use of blood thinners, chronic use of topical and/or oral steroids - Observe - Can use OTC arnica containing moisturizer such as Dermend Bruise Formula if desired - Call for worsening or other concerns  ATOPIC DERMATITIS legs Exam: pink scaly patches legs 1 %  BSA  Chronic and persistent condition with duration or expected duration over one year. Condition is improving with treatment but not currently at goal. Atopic dermatitis (eczema) is a chronic, relapsing, pruritic condition that can significantly affect quality of life. It is often associated with allergic rhinitis and/or asthma and can require treatment with topical medications, phototherapy, or in severe cases biologic injectable medication (Dupixent; Adbry) or Oral JAK inhibitors. Treatment Plan: Continue  as needed for flares  TMC 0.1% cr 3d/wk Friday, Saturday and Sundays to aa legs, avoid f/g/a Cont Elidel  cr qd aa legs prn flares Patient called and verified she is  continuing to use Elidel  cream to affected areas and Triamcinolone  only as needed for flares on legs. Refills sent and patient will pick up when she needs.    Recommend gentle skin care.    Topical steroids (such as triamcinolone , fluocinolone, fluocinonide, mometasone , clobetasol, halobetasol, betamethasone, hydrocortisone) can cause thinning and lightening of the skin if they are used for too long in the same area. Your physician has selected the right strength medicine for your problem and area affected on the body. Please use your medication only as directed by your physician to prevent side effects.    SEBORRHEIC DERMATITIS Exam: scalp with pinkness Pinkness at nasolabial folds  Chronic and persistent condition with duration or expected duration over one year. Condition is symptomatic / bothersome to patient. Not to goal. Seborrheic Dermatitis is a chronic persistent rash characterized by pinkness and scaling most commonly of the mid face but also can occur on the scalp (dandruff), ears; mid chest, mid back and groin.  It tends to be exacerbated by stress and cooler weather.  People who have neurologic disease may experience new onset or exacerbation of existing seborrheic dermatitis.  The condition is not curable but treatable and can be controlled. Treatment Plan: Elidel  (Pimecrolimus ) cream every day - bid prn Instructed can continue  Ketoconazole  2% shampoo q shampoo, let sit 5 minutes and rinse out. 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 Excised 10/09/2009 margins free  No evidence of recurrence today Recommend regular full body skin  exams Recommend daily broad spectrum sunscreen SPF 30+ to sun-exposed areas, reapply every 2 hours as needed.  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 Condition is symptomatic / bothersome to patient. Not to goal. Rosacea is a chronic progressive skin condition usually affecting the face of adults, causing redness and/or acne bumps. It is treatable but not curable. It sometimes affects the eyes (ocular rosacea) as well. It may respond to topical and/or systemic medication and can flare with stress, sun exposure, alcohol, exercise, topical steroids (including hydrocortisone/cortisone 10) and some foods.  Daily application of broad spectrum spf 30+ sunscreen to face is recommended to reduce flares.  Patient denies grittiness of the eyes   Treatment Plan Elidel  (Pimecrolimus ) cream every day - bid prn  INFLAMED SEBORRHEIC KERATOSIS (19) upper back spinal x 3, right forearm x 2, right lower leg pretibial x 1, right mandible x 2, right neck x 3, left shoulder x 4, left neck x 4 (19) Symptomatic, irritating, patient would like treated. Destruction of lesion - upper back spinal x 3, right forearm x 2, right lower leg pretibial x 1, right mandible x 2, right neck x 3, left shoulder x 4, left neck x 4 (19) Complexity: simple   Destruction method: cryotherapy   Informed consent: discussed and consent obtained  Timeout:  patient name, date of birth, surgical site, and procedure verified Lesion destroyed using liquid nitrogen: Yes   Region frozen until ice ball extended beyond lesion: Yes   Outcome: patient tolerated procedure well with no complications   Post-procedure details: wound care instructions given   OTHER ATOPIC DERMATITIS   Related Medications triamcinolone  cream (KENALOG ) 0.1 % Apply 1 Application topically as directed. Apply to lower legs  once daily on Friday, Saturday and Sunday as needed for flares, avoid face, groin, axilla pimecrolimus  (ELIDEL ) 1 % cream Apply to affected daily at legs twice daily as needed. SEBORRHEIC DERMATITIS   Related Medications ketoconazole  (NIZORAL ) 2 % shampoo Apply 1 Application topically as directed. Wash scalp, let sit 5 minutes and rinse out SKIN CANCER SCREENING   ACTINIC SKIN DAMAGE   LENTIGO   MELANOCYTIC NEVUS, UNSPECIFIED LOCATION   COUNSELING AND COORDINATION OF CARE   MEDICATION MANAGEMENT   ALLERGIC CONTACT DERMATITIS DUE TO METALS   HISTORY OF DYSPLASTIC NEVUS   ROSACEA   PURPURA (HCC)   SEBORRHEIC KERATOSIS   Return in about 1 year (around 08/04/2024) for TBSE.  IRandee Busing, CMA, am acting as scribe for Celine Collard, MD.   Documentation: I have reviewed the above documentation for accuracy and completeness, and I agree with the above.  Celine Collard, MD

## 2023-08-05 NOTE — Telephone Encounter (Signed)
 Pimecrolimus  sent to pharmacy by Randee Busing CMA.

## 2023-08-05 NOTE — Patient Instructions (Addendum)
 Send mychart message with name of medication cream you are using for irritation at face.    Seborrheic Keratosis  What causes seborrheic keratoses? Seborrheic keratoses are harmless, common skin growths that first appear during adult life.  As time goes by, more growths appear.  Some people may develop a large number of them.  Seborrheic keratoses appear on both covered and uncovered body parts.  They are not caused by sunlight.  The tendency to develop seborrheic keratoses can be inherited.  They vary in color from skin-colored to gray, brown, or even black.  They can be either smooth or have a rough, warty surface.   Seborrheic keratoses are superficial and look as if they were stuck on the skin.  Under the microscope this type of keratosis looks like layers upon layers of skin.  That is why at times the top layer may seem to fall off, but the rest of the growth remains and re-grows.    Treatment Seborrheic keratoses do not need to be treated, but can easily be removed in the office.  Seborrheic keratoses often cause symptoms when they rub on clothing or jewelry.  Lesions can be in the way of shaving.  If they become inflamed, they can cause itching, soreness, or burning.  Removal of a seborrheic keratosis can be accomplished by freezing, burning, or surgery. If any spot bleeds, scabs, or grows rapidly, please return to have it checked, as these can be an indication of a skin cancer.  Cryotherapy Aftercare  Wash gently with soap and water everyday.   Apply Vaseline and Band-Aid daily until healed.       Melanoma ABCDEs  Melanoma is the most dangerous type of skin cancer, and is the leading cause of death from skin disease.  You are more likely to develop melanoma if you: Have light-colored skin, light-colored eyes, or red or blond hair Spend a lot of time in the sun Tan regularly, either outdoors or in a tanning bed Have had blistering sunburns, especially during childhood Have a close  family member who has had a melanoma Have atypical moles or large birthmarks  Early detection of melanoma is key since treatment is typically straightforward and cure rates are extremely high if we catch it early.   The first sign of melanoma is often a change in a mole or a new dark spot.  The ABCDE system is a way of remembering the signs of melanoma.  A for asymmetry:  The two halves do not match. B for border:  The edges of the growth are irregular. C for color:  A mixture of colors are present instead of an even brown color. D for diameter:  Melanomas are usually (but not always) greater than 6mm - the size of a pencil eraser. E for evolution:  The spot keeps changing in size, shape, and color.  Please check your skin once per month between visits. You can use a small mirror in front and a large mirror behind you to keep an eye on the back side or your body.   If you see any new or changing lesions before your next follow-up, please call to schedule a visit.  Please continue daily skin protection including broad spectrum sunscreen SPF 30+ to sun-exposed areas, reapplying every 2 hours as needed when you're outdoors.   Staying in the shade or wearing long sleeves, sun glasses (UVA+UVB protection) and wide brim hats (4-inch brim around the entire circumference of the hat) are also recommended for  sun protection.    Due to recent changes in healthcare laws, you may see results of your pathology and/or laboratory studies on MyChart before the doctors have had a chance to review them. We understand that in some cases there may be results that are confusing or concerning to you. Please understand that not all results are received at the same time and often the doctors may need to interpret multiple results in order to provide you with the best plan of care or course of treatment. Therefore, we ask that you please give us  2 business days to thoroughly review all your results before contacting the  office for clarification. Should we see a critical lab result, you will be contacted sooner.   If You Need Anything After Your Visit  If you have any questions or concerns for your doctor, please call our main line at [phone] and press option 4 to reach your doctor's medical assistant. If no one answers, please leave a voicemail as directed and we will return your call as soon as possible. Messages left after 4 pm will be answered the following business day.   You may also send us  a message via MyChart. We typically respond to MyChart messages within 1-2 business days.  For prescription refills, please ask your pharmacy to contact our office. Our fax number is [phone].  If you have an urgent issue when the clinic is closed that cannot wait until the next business day, you can page your doctor at the number below.    Please note that while we do our best to be available for urgent issues outside of office hours, we are not available 24/7.   If you have an urgent issue and are unable to reach us , you may choose to seek medical care at your doctor's office, retail clinic, urgent care center, or emergency room.  If you have a medical emergency, please immediately call 911 or go to the emergency department.  Pager Numbers  - Dr. Bary Likes: [phone]  - Dr. Annette Barters: [phone]  - Dr. Felipe Horton: [phone]   In the event of inclement weather, please call our main line at [phone] for an update on the status of any delays or closures.  Dermatology Medication Tips: Please keep the boxes that topical medications come in in order to help keep track of the instructions about where and how to use these. Pharmacies typically print the medication instructions only on the boxes and not directly on the medication tubes.   If your medication is too expensive, please contact our office at [phone] option 4 or send us  a message through MyChart.   We are unable to tell what your  co-pay for medications will be in advance as this is different depending on your insurance coverage. However, we may be able to find a substitute medication at lower cost or fill out paperwork to get insurance to cover a needed medication.   If a prior authorization is required to get your medication covered by your insurance company, please allow us  1-2 business days to complete this process.  Drug prices often vary depending on where the prescription is filled and some pharmacies may offer cheaper prices.  The website www.goodrx.com contains coupons for medications through different pharmacies. The prices here do not account for what the cost may be with help from insurance (it may be cheaper with your insurance), but the website can give you the price if you did not use any insurance.  - You can print  the associated coupon and take it with your prescription to the pharmacy.  - You may also stop by our office during regular business hours and pick up a GoodRx coupon card.  - If you need your prescription sent electronically to a different pharmacy, notify our office through [facility] or by phone at [phone] option 4.     Si Usted Necesita Algo Despus de Su Visita  Tambin puede enviarnos un mensaje a travs de Clinical cytogeneticist. Por lo general respondemos a los mensajes de MyChart en el transcurso de 1 a 2 das hbiles.  Para renovar recetas, por favor pida a su farmacia que se ponga en contacto con nuestra oficina. Franz Jacks de fax es [place] [phone].  Si tiene un asunto urgente cuando la clnica est cerrada y que no puede esperar hasta el siguiente da hbil, puede llamar/localizar a su doctor(a) al nmero que aparece a continuacin.   Por favor, tenga en cuenta que aunque hacemos todo lo posible para estar disponibles para asuntos urgentes fuera del horario de [place], no estamos disponibles las 24 horas del da, los 7 [address]  [address] de la [place].   Si tiene un problema urgente y no  puede comunicarse con nosotros, puede optar por buscar atencin mdica  en el consultorio de su doctor(a), en una clnica privada, en un centro de atencin urgente o en una sala de emergencias.  Si tiene Engineer, drilling, por favor llame inmediatamente al 911 o vaya a la sala de emergencias.  Nmeros de bper  - Dr. Bary Likes: [phone]  - Dra. Annette Barters: [phone]  - Dr. Felipe Horton: [phone]   En caso de inclemencias del tiempo, por favor llame a Lajuan Pila principal al [phone] para una actualizacin sobre el [place] de cualquier retraso o cierre.  Consejos para la medicacin en dermatologa: Por favor, guarde las cajas en las que vienen los medicamentos de uso tpico para ayudarle a seguir las instrucciones sobre dnde y cmo usarlos. Las farmacias generalmente imprimen las instrucciones del medicamento slo en las cajas y no directamente en los tubos del [place].   Si su medicamento es muy caro, por favor, pngase en contacto con Bettyjane Brunet llamando al [phone] y presione la opcin 4 o envenos un mensaje a travs de Clinical cytogeneticist.   No podemos decirle cul ser su copago por los medicamentos por adelantado ya que esto es diferente dependiendo de la cobertura de su seguro. Sin embargo, es posible que podamos encontrar un medicamento sustituto a Audiological scientist un formulario para que el seguro cubra el medicamento que se considera necesario.   Si se requiere una autorizacin previa para que su compaa de seguros [place] su medicamento, por favor permtanos de 1 a 2 das hbiles para completar este proceso.  Los precios de los medicamentos varan con frecuencia dependiendo del Environmental consultant de dnde se surte la receta y alguna farmacias pueden ofrecer precios ms baratos.  El sitio web www.goodrx.com tiene cupones para medicamentos de Health and safety inspector. Los precios aqu no tienen en cuenta lo que podra costar con la ayuda del seguro (puede ser ms barato con su seguro),  pero el sitio web puede darle el precio si no utiliz Tourist information centre manager.  - Puede imprimir el cupn correspondiente y llevarlo con su receta a la farmacia.  - Tambin puede pasar por nuestra oficina durante el horario de atencin regular y Education officer, museum una tarjeta de cupones de GoodRx.  - Si necesita que su receta se enve electrnicamente a una farmacia diferente, informe a nuestra  oficina a travs de MyChart de [place] o por telfono llamando al [phone] y presione la opcin 4.

## 2023-08-09 ENCOUNTER — Telehealth: Payer: Self-pay | Admitting: Cardiovascular Disease

## 2023-08-09 NOTE — Telephone Encounter (Signed)
 Called and spoke with patient. Patient with complaint of low heart rate that started last week. Patient states that her heart rate stayed in the 40's last week and occasionally dropped to 39. Patient states that she she was feeling tired and and that her heart rate feels irregular. Patient states that she was seen by her PCP today and told to follow up with cardiology. BP today at PCP was 142/84. Patient scheduled to be seen in clinic 08/10/23.

## 2023-08-09 NOTE — Telephone Encounter (Signed)
 New Message:     Patient said she saw her primary doctor this morning (08-09-23) She says her heart rate is low She said he told her to contact her Cardiologist    STAT if HR is under 50 or over 120 (normal HR is 60-100 beats per minute)  What is your heart rate? Thursday- 76, 53 , it was 40 in the evening for about an hour Friday   39,40, 42  Saturday 40, 41, 42  Sunday 42, 41, 43 and today it is 40,41, 42  went up to 108 and 109, 105    Do you have a log of your heart rate readings (document readings)?   Do you have any other symptoms? No symptoms

## 2023-08-10 ENCOUNTER — Ambulatory Visit: Attending: Medical | Admitting: Medical

## 2023-08-10 ENCOUNTER — Encounter: Payer: Self-pay | Admitting: Medical

## 2023-08-10 ENCOUNTER — Ambulatory Visit

## 2023-08-10 VITALS — BP 122/74 | HR 65 | Resp 17 | Ht 62.0 in | Wt 201.0 lb

## 2023-08-10 DIAGNOSIS — I10 Essential (primary) hypertension: Secondary | ICD-10-CM | POA: Insufficient documentation

## 2023-08-10 DIAGNOSIS — I498 Other specified cardiac arrhythmias: Secondary | ICD-10-CM | POA: Diagnosis not present

## 2023-08-10 DIAGNOSIS — E782 Mixed hyperlipidemia: Secondary | ICD-10-CM | POA: Diagnosis present

## 2023-08-10 DIAGNOSIS — R001 Bradycardia, unspecified: Secondary | ICD-10-CM | POA: Insufficient documentation

## 2023-08-10 NOTE — Progress Notes (Unsigned)
  Cardiology Office Note:  .   Date:  08/10/2023  ID:  Heidi Richards, DOB 1945/09/08, MRN [id number] PCP: Antonio Baumgarten, MD  [facility] Providers Cardiologist:  None { }   History of Present Illness: .   Heidi Richards is a 78 y.o. female with a history of hypothyroidism, hypertension, diabetes type 2 who presents for low heart rate.  She has a history of sinus bradycardia and sinus arrhythmia noted on EKG.  She has been asymptomatic in the past. Patient was last seen by Dr. Gollan in September 2024 and was overall stable from a cardiac perspective.    Today, the patient reports she has an irregular heart beat. Last week she was very tired and noted lower heart rates than normal. It was going below 40 and up to 100bpm. She feels her heart pounding. Watch said HR was in the 40s 70% of the time. She denies dizziness, lightheadedness, chest pain, SOB, lower leg edema.    Studies Reviewed: Aaron Aas   EKG Interpretation Date/Time:  Tuesday August 10 2023 14:03:34 EDT Ventricular Rate:  65 PR Interval:  176 QRS Duration:  84 QT Interval:  380 QTC Calculation: 395 R Axis:   -33  Text Interpretation: Sinus rhythm with marked sinus arrhythmia Left axis deviation Minimal voltage criteria for LVH, may be normal variant ( R in aVL ) When compared with ECG of 04-Jan-2023 14:23, QRS axis Shifted right Criteria for Anterior infarct are no longer Present Criteria for Anterolateral infarct are no longer Present Confirmed by Gennaro Khat, Misty Rago ([id number]) on 08/10/2023 2:06:50 PM     Physical Exam:   VS:  BP 122/74 (BP Location: Left Arm, Patient Position: Sitting, Cuff Size: Large)   Pulse 65   Resp 17   Ht 5\' 2"  (1.575 m)   Wt 201 lb (91.2 kg)   SpO2 97%   BMI 36.76 kg/m    Wt Readings from Last 3 Encounters:  08/10/23 201 lb (91.2 kg)  06/04/23 202 lb 12.8 oz (92 kg)  01/04/23 202 lb 4 oz (91.7 kg)    GEN: Well nourished, well developed in no acute distress NECK: No JVD; No carotid  bruits CARDIAC: bradycardia, RR, no murmurs, rubs, gallops RESPIRATORY:  Clear to auscultation without rales, wheezing or rhonchi  ABDOMEN: Soft, non-tender, non-distended EXTREMITIES:  No edema; No deformity   ASSESSMENT AND PLAN: .    Bradycardia Sinus arrhythmia Patient has a history of sinus bradycardia and sinus arhythmia. She reports last wee her watch notified her heart rate was down into the 40s most of the time with occasional jumps up to 100. She did feel tired, but was also recovering from [place]. She denies pre-syncope, syncope, chest pain, Sob, or LLE. EKG today shows NSR, 62bpm with sinus arrhythmia. She is not on any rate lowering medications. Recent labs unremarkable. I will check a Mag. I will order a Live 2 week heart monitor and an echo.   HTN BP is normal today. Continue Amlodipine  HLD Continue Zetia  10mg  daily.        Dispo: Follow-up in 6-8 weeks  Signed, Rane Blitch Rebekah [place], PA-C

## 2023-08-10 NOTE — Patient Instructions (Signed)
 Medication Instructions:  Your Physician recommend you continue on your current medication as directed.    *If you need a refill on your cardiac medications before your next appointment, please call your pharmacy*  Lab Work: Your provider would like for you to have following labs drawn today Magnesium.   If you have labs (blood work) drawn today and your tests are completely normal, you will receive your results only by: MyChart Message (if you have MyChart) OR A paper copy in the mail If you have any lab test that is abnormal or we need to change your treatment, we will call you to review the results.  Testing/Procedures: Your physician has requested that you have an echocardiogram. Echocardiography is a painless test that uses sound waves to create images of your heart. It provides your doctor with information about the size and shape of your heart and how well your heart's chambers and valves are working.   You may receive an ultrasound enhancing agent through an IV if needed to better visualize your heart during the echo. This procedure takes approximately one hour.  There are no restrictions for this procedure.  This will take place at [address] [facility] [address] [facility], [place] [address]  Please note: We ask at that you not bring children with you during ultrasound (echo/ vascular) testing. Due to room size and safety concerns, children are not allowed in the ultrasound rooms during exams. Our front office staff cannot provide observation of children in our lobby area while testing is being conducted. An adult accompanying a patient to their appointment will only be allowed in the ultrasound room at the discretion of the ultrasound technician under special circumstances. We apologize for any inconvenience.   Your physician has recommended that you wear a Zio monitor.   This monitor is a medical device that records the heart's electrical activity. Doctors most often use  these monitors to diagnose arrhythmias. Arrhythmias are problems with the speed or rhythm of the heartbeat. The monitor is a small device applied to your chest. You can wear one while you do your normal daily activities. While wearing this monitor if you have any symptoms to push the button and record what you felt. Once you have worn this monitor for the period of time provider prescribed (Usually 14 days), you will return the monitor device in the postage paid box. Once it is returned they will download the data collected and provide us  with a report which the provider will then review and we will call you with those results. Important tips:  Avoid showering during the first 24 hours of wearing the monitor. Avoid excessive sweating to help maximize wear time. Do not submerge the device, no hot tubs, and no swimming pools. Keep any lotions or oils away from the patch. After 24 hours you may shower with the patch on. Take brief showers with your back facing the shower head.  Do not remove patch once it has been placed because that will interrupt data and decrease adhesive wear time. Push the button when you have any symptoms and write down what you were feeling. Once you have completed wearing your monitor, remove and place into box which has postage paid and place in your outgoing mailbox.  If for some reason you have misplaced your box then call our office and we can provide another box and/or mail it off for you.   Follow-Up: At [facility], you and your health needs are our priority.  As part of our continuing mission to provide you with exceptional heart care, our providers are all part of one team.  This team includes your primary Cardiologist (physician) and Advanced Practice Providers or APPs (Physician Assistants and Nurse Practitioners) who all work together to provide you with the care you need, when you need it.  Your next appointment:   1-2 month(s)  Provider:   You may see  Gennaro Khat, Cadence or one of the following Advanced Practice Providers on your designated Care Team:   Laneta Pintos, NP Gildardo Labrador, PA-C Varney Gentleman, PA-C Cadence [place], PA-C Ronald Cockayne, NP Morey Ar, NP    We recommend signing up for the patient portal called "MyChart".  Sign up information is provided on this After Visit Summary.  MyChart is used to connect with patients for Virtual Visits (Telemedicine).  Patients are able to view lab/test results, encounter notes, upcoming appointments, etc.  Non-urgent messages can be sent to your provider as well.   To learn more about what you can do with MyChart, go to [email or web address].

## 2023-08-11 LAB — MAGNESIUM: Magnesium: 2.1 mg/dL (ref 1.6–2.3)

## 2023-08-20 ENCOUNTER — Telehealth: Payer: Self-pay | Admitting: Cardiology

## 2023-08-20 NOTE — Telephone Encounter (Signed)
   Called by [facility] team that the patient had an episode of back to back pauses of 3.4, 3.5 seconds that occurred around 4:17AM. This was a device trigger, not a patient triggered event. Will notify EP team  Jadasia Haws Rollene Clink 08/20/23 5:40 AM

## 2023-09-07 ENCOUNTER — Telehealth: Payer: Self-pay | Admitting: Cardiovascular Disease

## 2023-09-07 DIAGNOSIS — R001 Bradycardia, unspecified: Secondary | ICD-10-CM

## 2023-09-07 NOTE — Telephone Encounter (Signed)
 Call report. Please advised  Got disconnect while trying transfer

## 2023-09-07 NOTE — Telephone Encounter (Signed)
 Spoke [employer] called in with MD alert for the following events:   May 7 at 6:50 am symptomatic bradycardia with HR 36 lasting 60 seconds  May 8 at 5:58 am symptomatic bradycardia with HR 38 lasting 60 seconds  May 13 4:00 am symptomatic bradycardia with HR 39 lasting 60 seconds

## 2023-09-07 NOTE — Telephone Encounter (Signed)
 Reviewed all events with DOD Dr. Junnie Olives and requested referral to EP.

## 2023-09-07 NOTE — Telephone Encounter (Signed)
 Spoke with patient in regards to her monitor. Discussed that provider here in the office reviewed heart monitor for 3 events where heart rates were low and his recommendations were to place referral to our EP team. She verbalized understanding and advised someone would be in touch to get that scheduled.

## 2023-09-07 NOTE — Telephone Encounter (Signed)
 Unable to hear anyone on [phone]

## 2023-09-09 DIAGNOSIS — R001 Bradycardia, unspecified: Secondary | ICD-10-CM | POA: Diagnosis not present

## 2023-09-13 NOTE — Progress Notes (Signed)
 Electrophysiology Office Note:   Date:  09/14/2023  ID:  HAZLEIGH MCCLEAVE, DOB July 05, 1945, MRN [id number]  Primary Cardiologist: None Electrophysiologist: Ardeen Kohler, MD      History of Present Illness:   Heidi Richards is a 78 y.o. female with h/o hypothyroidism, sinus arrhythmia, hypertension, diabetes type 2 who is being who is being seen today for evaluation of her bradycardia at the request of Cadence Furth, [place].   Discussed the use of AI scribe software for clinical note transcription with the patient, who gave verbal consent to proceed.  History of Present Illness She experiences episodes where her heart rate occasionally drops below 40 beats per minute, sometimes reaching 38 or 39, but most of the time it remains in the 40s. She experiences a sensation of pounding in her chest, which sometimes wakes her up at night and also occurs during the day. On 1 or 2 occasions, she did feel lightheaded with slower heart rates, but states this only lasted for brief second. She wore a heart monitor and pressed the button primarily when she felt the pounding sensation. She does not feel like she is going to pass out, but when her heart rate is below 40, she sometimes feels lightheaded for a few seconds. She does not feel like she is going to pass out. Her watch has recorded her heart rate as high as 105, but usually, it is in the 60s with activity. She has not experienced any inappropriate fast heart rates according to her watch. She mentions that when she moves after being notified of a low heart rate by her watch that her heart rate quickly picks back up.  Review of systems complete and found to be negative unless listed in HPI.   EP Information / Studies Reviewed:    EKG is ordered today. Personal review as below.  EKG Interpretation Date/Time:  Tuesday September 14 2023 08:11:36 EDT Ventricular Rate:  47 PR Interval:  164 QRS Duration:  76 QT Interval:  424 QTC Calculation: 375 R  Axis:   -23  Text Interpretation: Sinus bradycardia with marked sinus arrhythmia When compared with ECG of 10-Aug-2023 14:03, No significant change was found Confirmed by Ardeen Kohler [phone]) on 09/14/2023 8:18:34 AM   Zio 08/2023:   Physical Exam:   VS:  BP 132/70   Pulse (!) 47   Ht 5\' 2"  (1.575 m)   Wt 200 lb 12.8 oz (91.1 kg)   SpO2 96%   BMI 36.73 kg/m    Wt Readings from Last 3 Encounters:  09/14/23 200 lb 12.8 oz (91.1 kg)  08/10/23 201 lb (91.2 kg)  06/04/23 202 lb 12.8 oz (92 kg)     GEN: Well nourished, well developed in no acute distress NECK: No JVD CARDIAC: Bradycardic, irregular RESPIRATORY:  Clear to auscultation without rales, wheezing or rhonchi  ABDOMEN: Soft, non-distended EXTREMITIES:  No edema; No deformity   ASSESSMENT AND PLAN:    #Palpitations: Described as a pounding sensation. Appear to correlate mainly with sinus arrhythmia and/or PACs. Only 1 brief episode of SVT on monitor, lasting 9 seconds, not associated with symptoms.  - Reassured patient that so far monitoring and ECGs have not shown any sustained abnormal rhythms that would warrant treatment. She felt like knowing this might be sufficient to manage for now. If pounding sensation is due to irregular beating, it is possible pacemaker implant with base heart rate of 60-70bpm would overdrive her sinus arrhythmia/PACs and help symptoms. At this time, she would  like to avoid pacemaker and I think this is reasonable given absence of more urgent/emergent pacing needs.   #Sinus bradycardia: Appears asymptomatic from slow heart rates. Symptom trigger episodes that correlate with bradycardia on Zio were because of Apple Watch notifications for low heart rates or palpitations. She has rare lightheadedness with low heart rates, lasting a second and quickly resolving, not consistent.  #Nocturnal sinus pauses:  - Exercise treadmill test to assess for chronotropic competence.  - At this time, I would not place  pacemaker for asymptomatic bradycardia alone. We discussed it is likely she may need pacemaker in the future. If she were to develop consistent symptoms that could be reliably attributed to her bradycardia then benefits of pacer implant would exceed risks at that time. Also, if she were to develop tachycardia warranting treatment then implant for tachycardia-bradycardia syndrome would be reasonable.  - Avoid AVN blocking or bradycardia inducing agents.    #Hypertension -At goal today.  Recommend checking blood pressures 1-2 times per week at home and recording the values.  Recommend bringing these recordings to the primary care physician.  #HLD: LDL 79.  -Continue Zetia  10mg  daily. Follow up with primary cardiologist.  Follow up with EP APP in 6 months  Signed, Ardeen Kohler, MD

## 2023-09-14 ENCOUNTER — Encounter: Payer: Self-pay | Admitting: Cardiology

## 2023-09-14 ENCOUNTER — Ambulatory Visit: Attending: Cardiology | Admitting: Cardiology

## 2023-09-14 VITALS — BP 132/70 | HR 47 | Ht 62.0 in | Wt 200.8 lb

## 2023-09-14 DIAGNOSIS — I10 Essential (primary) hypertension: Secondary | ICD-10-CM | POA: Insufficient documentation

## 2023-09-14 DIAGNOSIS — R001 Bradycardia, unspecified: Secondary | ICD-10-CM | POA: Diagnosis not present

## 2023-09-14 DIAGNOSIS — E785 Hyperlipidemia, unspecified: Secondary | ICD-10-CM | POA: Insufficient documentation

## 2023-09-14 DIAGNOSIS — I498 Other specified cardiac arrhythmias: Secondary | ICD-10-CM | POA: Diagnosis present

## 2023-09-14 DIAGNOSIS — R002 Palpitations: Secondary | ICD-10-CM | POA: Diagnosis present

## 2023-09-14 NOTE — Patient Instructions (Signed)
 Medication Instructions:  Your physician recommends that you continue on your current medications as directed. Please refer to the Current Medication list given to you today.  *If you need a refill on your cardiac medications before your next appointment, please call your pharmacy*  Testing/Procedures: Exercise Tolerance Test Your physician has requested that you have an exercise tolerance test. For further information please visit [email or web address] Please also follow instruction sheet, as given.  Follow-Up: At [facility], you and your health needs are our priority.  As part of our continuing mission to provide you with exceptional heart care, our providers are all part of one team.  This team includes your primary Cardiologist (physician) and Advanced Practice Providers or APPs (Physician Assistants and Nurse Practitioners) who all work together to provide you with the care you need, when you need it.  Your next appointment:   6 months  Provider:   Suzann Riddle, NP

## 2023-09-15 ENCOUNTER — Ambulatory Visit: Payer: Self-pay | Admitting: Medical

## 2023-09-20 ENCOUNTER — Ambulatory Visit: Attending: Medical

## 2023-09-20 DIAGNOSIS — R001 Bradycardia, unspecified: Secondary | ICD-10-CM | POA: Diagnosis not present

## 2023-09-21 LAB — ECHOCARDIOGRAM COMPLETE
Area-P 1/2: 2.8 cm2
S' Lateral: 2.33 cm

## 2023-09-24 ENCOUNTER — Ambulatory Visit
Admission: RE | Admit: 2023-09-24 | Discharge: 2023-09-24 | Disposition: A | Discharge status: Home / Self Care | Source: Ambulatory Visit | Attending: Cardiology | Admitting: Cardiology

## 2023-09-24 DIAGNOSIS — I10 Essential (primary) hypertension: Secondary | ICD-10-CM | POA: Insufficient documentation

## 2023-09-24 DIAGNOSIS — R001 Bradycardia, unspecified: Secondary | ICD-10-CM | POA: Diagnosis not present

## 2023-09-24 DIAGNOSIS — E119 Type 2 diabetes mellitus without complications: Secondary | ICD-10-CM | POA: Diagnosis not present

## 2023-09-24 DIAGNOSIS — R002 Palpitations: Secondary | ICD-10-CM | POA: Diagnosis not present

## 2023-09-24 DIAGNOSIS — E039 Hypothyroidism, unspecified: Secondary | ICD-10-CM | POA: Diagnosis not present

## 2023-09-24 DIAGNOSIS — I498 Other specified cardiac arrhythmias: Secondary | ICD-10-CM | POA: Diagnosis not present

## 2023-09-27 LAB — EXERCISE TOLERANCE TEST
Angina Index: 0
Duke Treadmill Score: 5
Estimated workload: 6.6
Exercise duration (min): 4 min
Exercise duration (sec): 43 s
MPHR: 141 {beats}/min
Peak HR: 113 {beats}/min
Percent HR: 80 %
Rest HR: 71 {beats}/min
ST Depression (mm): 0 mm

## 2023-09-30 ENCOUNTER — Ambulatory Visit: Attending: Medical | Admitting: Medical

## 2023-09-30 ENCOUNTER — Encounter: Payer: Self-pay | Admitting: Medical

## 2023-09-30 VITALS — BP 128/62 | HR 57 | Ht 62.5 in | Wt 201.4 lb

## 2023-09-30 DIAGNOSIS — R002 Palpitations: Secondary | ICD-10-CM

## 2023-09-30 DIAGNOSIS — E782 Mixed hyperlipidemia: Secondary | ICD-10-CM | POA: Diagnosis present

## 2023-09-30 DIAGNOSIS — I10 Essential (primary) hypertension: Secondary | ICD-10-CM

## 2023-09-30 DIAGNOSIS — I455 Other specified heart block: Secondary | ICD-10-CM | POA: Diagnosis present

## 2023-09-30 DIAGNOSIS — R001 Bradycardia, unspecified: Secondary | ICD-10-CM

## 2023-09-30 NOTE — Patient Instructions (Signed)
 Medication Instructions:  Your physician recommends that you continue on your current medications as directed. Please refer to the Current Medication list given to you today.    *If you need a refill on your cardiac medications before your next appointment, please call your pharmacy*  Lab Work: No labs ordered today    Testing/Procedures: No test ordered today   Follow-Up: At [facility], you and your health needs are our priority.  As part of our continuing mission to provide you with exceptional heart care, our providers are all part of one team.  This team includes your primary Cardiologist (physician) and Advanced Practice Providers or APPs (Physician Assistants and Nurse Practitioners) who all work together to provide you with the care you need, when you need it.  Your next appointment:   3 month(s)  Provider:   Toribio Frees, PA-C

## 2023-09-30 NOTE — Progress Notes (Signed)
 Cardiology Office Note   Date:  09/30/2023  ID:  MEKIA DIPINTO, DOB 08-09-1945, MRN [id number] PCP: Antonio Baumgarten, MD  [facility] Providers Cardiologist:  None Electrophysiologist:  Ardeen Kohler, MD   History of Present Illness Heidi Richards is a 78 y.o. female with a history of hypothyroidism, sinus arrhythmia, hypertension, diabetes type 2 who presents for follow-up for low heart rate.   She has a history of sinus bradycardia and sinus arrhythmia noted on EKG.  She has been asymptomatic in the past. Patient was last seen by Dr. Gollan in September 2024 and was overall stable from a cardiac perspective.    Patient was last seen 08/10/2023 reporting irregular heartbeat as well as 40 and up to 100 bpm.  She felt her heart pounding.  Heart monitor showed normal sinus rhythm with average heart rate of 49 bpm, range 29-1 7 9.  Patient had episodes of sinus pauses lasting up to 4.5 seconds, bradycardia associated with triggered events with heart rate in the 30s, 1 episode of SVT, no A-fib or flutter.  She was referred to EP.  Echo showed EF of 60 to 65%, grade 1 diastolic dysfunction, no significant valve disease.  Patient saw EP 10/11/2023.  ETT was ordered to assess for chronotropic competence.  It was felt pacemaker was not required at this time.  ETT did not show significant ST changes during stress.  Frequent PACs and PVCs observed during stress and recovery.  There was failure to achieve target heart rate due to decreased exercise capacity.  Today, the patient reports she is feeling OK. She feels maybe her symptoms were from stress. She still has low heart rate according to her Apple watch. Says HR will go low when she is at rest. She denies lightheadedness, dizziness, syncope, chest pain or SOB.   Studies Reviewed EKG Interpretation Date/Time:  Thursday September 30 2023 14:08:06 EDT Ventricular Rate:  57 PR Interval:  164 QRS Duration:  84 QT Interval:  404 QTC  Calculation: 393 R Axis:   -30  Text Interpretation: Sinus bradycardia with marked sinus arrhythmia Left axis deviation Minimal voltage criteria for LVH, may be normal variant ( R in aVL ) When compared with ECG of 14-Sep-2023 08:11, No significant change was found Confirmed by Gennaro Khat, Chrishun Scheer ([id number]) on 09/30/2023 2:20:44 PM    Heart monitor 08/2023  Conclusion Average heart rate 49 bpm, range 29-1 79. Episodes of sinus pauses lasting up to 4.5 seconds noted.  Referral to EP previously placed.  Patient on no AV nodal agents. Episodes of bradycardia associated with patient triggered events.  Heart rate in the 30s. 1 episode of nonsustained SVT. No sustained arrhythmias. No A-fib or flutter.  Echo 09/2023  1. Left ventricular ejection fraction, by estimation, is 60 to 65%. The  left ventricle has normal function. The left ventricle has no regional  wall motion abnormalities. Left ventricular diastolic parameters are  consistent with Grade I diastolic  dysfunction (impaired relaxation).   2. Right ventricular systolic function is normal. The right ventricular  size is normal.   3. The mitral valve is normal in structure. No evidence of mitral valve  regurgitation.   4. The aortic valve is tricuspid. Aortic valve regurgitation is not  visualized.   5. The inferior vena cava is normal in size with greater than 50%  respiratory variability, suggesting right atrial pressure of 3 mmHg.    ETT 09/2023    Baseline ECG demonstrates normal sinus rhythm with PACs and  nonspecific T wave abnormalities.   The patient exhibits decreased exercise capacity with failure to achieve target heart rate (reached 141 bpm; 80% MPHR).  No angina was reported.   No significant ST segment changes were seen during stress.   Frequent PACs and rare PVCs were observed during stress and recovery.  No sustained arrhythmia noted.   Nondiagnostic exercise tolerance test for myocardial ischemia due to failure to achieve  target heart rate.  Normal heart rate response to exercise noted with supraventricular and ventricular ectopy, as noted above.     Physical Exam VS:  BP 128/62   Pulse (!) 57   Ht 5' 2.5 (1.588 m)   Wt 201 lb 6.4 oz (91.4 kg)   SpO2 94%   BMI 36.25 kg/m    Wt Readings from Last 3 Encounters:  09/30/23 201 lb 6.4 oz (91.4 kg)  09/14/23 200 lb 12.8 oz (91.1 kg)  08/10/23 201 lb (91.2 kg)    GEN: Well nourished, well developed in no acute distress NECK: No JVD; No carotid bruits CARDIAC: bradycardia,  RR, no murmurs, rubs, gallops RESPIRATORY:  Clear to auscultation without rales, wheezing or rhonchi  ABDOMEN: Soft, non-tender, non-distended EXTREMITIES:  No edema; No deformity   ASSESSMENT AND PLAN  Sinus bradycardia Sinus pause Palpitations Heart monitor showed SB with average heart rate of 49bpm, sinus pauses up to 4.5 seconds, 1 run of SVT. She was not on a BB. Patient saw EP who felt bradycardia was asymptomatic.  Her only symptoms would be heart pounding, which were not associated with any arrhythmia. It was felt PPM was not required, and patient would ike to avoid PPM. ETT was ordered to assess chronotropic competence.  ETT showed good chronotropic competence. She denies any further symptoms. Before, when she had bradycardia her watch would notify her and she was normally at rest. She denies chest pain ,SOB, lightheadedness, dizziness, syncope. She has occasional heart pounding. At this point, we will continue to watch symptoms. We did discuss sleep study due to pauses at night, but patient would like to think about this. Can see EP in 6 months.   HTN BP is normal. Continue hydrochlorothiazide  HLD LDL 79. Continue Zetia .     Dispo: Follow-up in 3 months  Signed, Samel Bruna Rebekah Canada, PA-C

## 2023-10-30 ENCOUNTER — Ambulatory Visit: Payer: Self-pay | Admitting: Cardiology

## 2023-11-01 ENCOUNTER — Other Ambulatory Visit: Payer: Self-pay | Admitting: Cardiovascular Disease

## 2023-12-03 ENCOUNTER — Other Ambulatory Visit: Payer: Self-pay | Admitting: Internal Medicine

## 2023-12-03 DIAGNOSIS — Z1231 Encounter for screening mammogram for malignant neoplasm of breast: Secondary | ICD-10-CM

## 2023-12-31 ENCOUNTER — Ambulatory Visit: Admitting: Medical

## 2024-01-06 ENCOUNTER — Ambulatory Visit
Admission: RE | Admit: 2024-01-06 | Discharge: 2024-01-06 | Disposition: A | Discharge status: Home / Self Care | Source: Ambulatory Visit | Attending: Internal Medicine | Admitting: Internal Medicine

## 2024-01-06 DIAGNOSIS — Z1231 Encounter for screening mammogram for malignant neoplasm of breast: Secondary | ICD-10-CM | POA: Diagnosis present

## 2024-01-13 ENCOUNTER — Ambulatory Visit: Attending: Medical | Admitting: Medical

## 2024-01-13 ENCOUNTER — Encounter: Payer: Self-pay | Admitting: Medical

## 2024-01-13 VITALS — BP 126/70 | HR 46 | Wt 203.0 lb

## 2024-01-13 DIAGNOSIS — I10 Essential (primary) hypertension: Secondary | ICD-10-CM | POA: Diagnosis not present

## 2024-01-13 DIAGNOSIS — I455 Other specified heart block: Secondary | ICD-10-CM | POA: Insufficient documentation

## 2024-01-13 DIAGNOSIS — R001 Bradycardia, unspecified: Secondary | ICD-10-CM | POA: Diagnosis not present

## 2024-01-13 DIAGNOSIS — E782 Mixed hyperlipidemia: Secondary | ICD-10-CM | POA: Diagnosis not present

## 2024-01-13 NOTE — Patient Instructions (Signed)
 Medication Instructions:  Your physician recommends that you continue on your current medications as directed. Please refer to the Current Medication list given to you today.  *If you need a refill on your cardiac medications before your next appointment, please call your pharmacy*  Lab Work: No labs ordered today  If you have labs (blood work) drawn today and your tests are completely normal, you will receive your results only by: MyChart Message (if you have MyChart) OR A paper copy in the mail If you have any lab test that is abnormal or we need to change your treatment, we will call you to review the results.  Testing/Procedures: No test ordered today   Follow-Up: At [facility], you and your health needs are our priority.  As part of our continuing mission to provide you with exceptional heart care, our providers are all part of one team.  This team includes your primary Cardiologist (physician) and Advanced Practice Providers or APPs (Physician Assistants and Nurse Practitioners) who all work together to provide you with the care you need, when you need it.  Your next appointment:   6 month(s)  Provider:   You may see  one of the following Advanced Practice Providers on your designated Care Team:   Laneta Pintos, NP Gildardo Labrador, PA-C Varney Gentleman, PA-C Cadence Kingston, PA-C Ronald Cockayne, NP Morey Ar, NP

## 2024-01-13 NOTE — Progress Notes (Unsigned)
 Cardiology Office Note   Date:  01/13/2024  ID:  Heidi Richards, DOB 1946/01/12, MRN [id number] PCP: Heidi Manna, MD  [facility] Providers Cardiologist:  None Electrophysiologist:  Heidi Kitty, MD   History of Present Illness Heidi Richards is a 78 y.o. female  with a history of hypothyroidism, sinus arrhythmia, hypertension, diabetes type 2 who presents for follow-up for sinus pause and sinus bradycardia.   She has a history of sinus bradycardia and sinus arrhythmia noted on EKG.  She has been asymptomatic in the past. Patient was last seen by Dr. Gollan in September 2024 and was overall stable from a cardiac perspective.     Patient was seen 08/10/2023 reporting irregular heartbeat as well as 40 and up to 100 bpm.  She felt her heart pounding.  Heart monitor showed normal sinus rhythm with average heart rate of 49 bpm, range 29-179.  Patient had episodes of sinus pauses lasting up to 4.5 seconds, bradycardia associated with triggered events with heart rate in the 30s, 1 episode of SVT, no A-fib or flutter.  She was referred to EP.  Echo showed EF of 60 to 65%, grade 1 diastolic dysfunction, no significant valve disease.   Patient saw EP 10/11/2023.  ETT was ordered to assess for chronotropic competence.  It was felt pacemaker was not required at this time.  ETT did not show significant ST changes during stress.  Frequent PACs and PVCs observed during stress and recovery.  There was failure to achieve target heart rate due to decreased exercise capacity, but overall chronotropic competence was good with HR increase 60>113bpm.   Patient was last seen 09/30/2023 and was feeling okay.  She thought maybe her symptoms were from stress.  Today, the patient is overall OK. She feels tired, but has been doing a lot. In the last week she had 3 reports (from her watch) of low HR below 40. Sometimes she feels different, but is unsure how to describe this. It's worse when she's stressed or  doing a lot. HR is low in the morning when she is sitting or at night. She denies chest pain, SOB, lower leg edema. She is walking 0.5 -1 mile on the treadmill at a time.    Studies Reviewed EKG Interpretation Date/Time:  Thursday January 13 2024 16:01:47 EDT Ventricular Rate:  46 PR Interval:  140 QRS Duration:  80 QT Interval:  436 QTC Calculation: 381 R Axis:   -26  Text Interpretation: Sinus bradycardia with Premature atrial complexes Cannot rule out Anterior infarct , age undetermined When compared with ECG of 30-Sep-2023 14:08, Premature atrial complexes are now Present Confirmed by Franchester, Eulon Allnutt ([id number]) on 01/13/2024 4:25:39 PM     Heart monitor 08/2023 Conclusion Average heart rate 49 bpm, range 29-1 79. Episodes of sinus pauses lasting up to 4.5 seconds noted.  Referral to EP previously placed.  Patient on no AV nodal agents. Episodes of bradycardia associated with patient triggered events.  Heart rate in the 30s. 1 episode of nonsustained SVT. No sustained arrhythmias. No A-fib or flutter.   Echo 09/2023  1. Left ventricular ejection fraction, by estimation, is 60 to 65%. The  left ventricle has normal function. The left ventricle has no regional  wall motion abnormalities. Left ventricular diastolic parameters are  consistent with Grade I diastolic  dysfunction (impaired relaxation).   2. Right ventricular systolic function is normal. The right ventricular  size is normal.   3. The mitral valve is normal in structure.  No evidence of mitral valve  regurgitation.   4. The aortic valve is tricuspid. Aortic valve regurgitation is not  visualized.   5. The inferior vena cava is normal in size with greater than 50%  respiratory variability, suggesting right atrial pressure of 3 mmHg.      ETT 09/2023    Baseline ECG demonstrates normal sinus rhythm with PACs and nonspecific T wave abnormalities.   The patient exhibits decreased exercise capacity with failure to achieve  target heart rate (reached 141 bpm; 80% MPHR).  No angina was reported.   No significant ST segment changes were seen during stress.   Frequent PACs and rare PVCs were observed during stress and recovery.  No sustained arrhythmia noted.   Nondiagnostic exercise tolerance test for myocardial ischemia due to failure to achieve target heart rate.  Normal heart rate response to exercise noted with supraventricular and ventricular ectopy, as noted above.      Physical Exam VS:  BP 126/70 (BP Location: Left Arm, Patient Position: Sitting)   Pulse (!) 46   Wt 203 lb (92.1 kg)   SpO2 97%   BMI 36.54 kg/m        Wt Readings from Last 3 Encounters:  01/13/24 203 lb (92.1 kg)  09/30/23 201 lb 6.4 oz (91.4 kg)  09/14/23 200 lb 12.8 oz (91.1 kg)    GEN: Well nourished, well developed in no acute distress NECK: No JVD; No carotid bruits CARDIAC: bradycardia, RR, + murmur, no rubs, gallops RESPIRATORY:  Clear to auscultation without rales, wheezing or rhonchi  ABDOMEN: Soft, non-tender, non-distended EXTREMITIES:  No edema; No deformity   ASSESSMENT AND PLAN  Sinus bradycardia Sinus pause Heart monitor 08/2023 showed SB with average heart rate of 49bpm, sinus pause up to 4.5 seconds, 1 run of SVT, episode of bradycardia associated with patient with patient triggered events with HR in the 30s. She is not on rate lowering medications. Patient saw EP who felt bradycardia was asymptomatic. It was felt PPM was not required and patient was wanting to avoid PPM. ETT was ordered to assess chronotropic competence. ETT showed good chronotropic competence. EKG today shows SB, 46bpm. Heart watch notifies her when her heart rate is below 40 for more than 10 minutes, and this has occurred 3 times in the last week. When this occurs she may or may not have symptoms. She just feels strange when heart rate is low. She does have fatigue, but this after she does a lot, but suspect low hear rate is contributing. Plan to  follow-up with EP. Continue to monitor symptoms.   HTN BP is normal. Continue hydrochlorothiazide  HLD LDL 79. Continue Zetia .        Dispo: Follow-up in 6 months  Signed, Heidi Richards Heidi Fishman, PA-C

## 2024-01-18 ENCOUNTER — Ambulatory Visit (INDEPENDENT_AMBULATORY_CARE_PROVIDER_SITE_OTHER): Admitting: Podiatry

## 2024-01-18 DIAGNOSIS — M722 Plantar fascial fibromatosis: Secondary | ICD-10-CM

## 2024-01-18 NOTE — Patient Instructions (Signed)
 Plantar Fasciitis (Heel Spur Syndrome) with Rehab The plantar fascia is a fibrous, ligament-like, soft-tissue structure that spans the bottom of the foot. Plantar fasciitis is a condition that causes pain in the foot due to inflammation of the tissue. SYMPTOMS  Pain and tenderness on the underneath side of the foot. Pain that worsens with standing or walking. CAUSES  Plantar fasciitis is caused by irritation and injury to the plantar fascia on the underneath side of the foot. Common mechanisms of injury include: Direct trauma to bottom of the foot. Damage to a small nerve that runs under the foot where the main fascia attaches to the heel bone. Stress placed on the plantar fascia due to bone spurs. RISK INCREASES WITH:  Activities that place stress on the plantar fascia (running, jumping, pivoting, or cutting). Poor strength and flexibility. Improperly fitted shoes. Tight calf muscles. Flat feet. Failure to warm-up properly before activity. Obesity. PREVENTION Warm up and stretch properly before activity. Allow for adequate recovery between workouts. Maintain physical fitness: Strength, flexibility, and endurance. Cardiovascular fitness. Maintain a health body weight. Avoid stress on the plantar fascia. Wear properly fitted shoes, including arch supports for individuals who have flat feet.  PROGNOSIS  If treated properly, then the symptoms of plantar fasciitis usually resolve without surgery. However, occasionally surgery is necessary.  RELATED COMPLICATIONS  Recurrent symptoms that may result in a chronic condition. Problems of the lower back that are caused by compensating for the injury, such as limping. Pain or weakness of the foot during push-off following surgery. Chronic inflammation, scarring, and partial or complete fascia tear, occurring more often from repeated injections.  TREATMENT  Treatment initially involves the use of ice and medication to help reduce pain and  inflammation. The use of strengthening and stretching exercises may help reduce pain with activity, especially stretches of the Achilles tendon. These exercises may be performed at home or with a therapist. Your caregiver may recommend that you use heel cups of arch supports to help reduce stress on the plantar fascia. Occasionally, corticosteroid injections are given to reduce inflammation. If symptoms persist for greater than 6 months despite non-surgical (conservative), then surgery may be recommended.   MEDICATION  If pain medication is necessary, then nonsteroidal anti-inflammatory medications, such as aspirin and ibuprofen, or other minor pain relievers, such as acetaminophen, are often recommended. Do not take pain medication within 7 days before surgery. Prescription pain relievers may be given if deemed necessary by your caregiver. Use only as directed and only as much as you need. Corticosteroid injections may be given by your caregiver. These injections should be reserved for the most serious cases, because they may only be given a certain number of times.  HEAT AND COLD Cold treatment (icing) relieves pain and reduces inflammation. Cold treatment should be applied for 10 to 15 minutes every 2 to 3 hours for inflammation and pain and immediately after any activity that aggravates your symptoms. Use ice packs or massage the area with a piece of ice (ice massage). Heat treatment may be used prior to performing the stretching and strengthening activities prescribed by your caregiver, physical therapist, or athletic trainer. Use a heat pack or soak the injury in warm water.  SEEK IMMEDIATE MEDICAL CARE IF: Treatment seems to offer no benefit, or the condition worsens. Any medications produce adverse side effects.  EXERCISES- RANGE OF MOTION (ROM) AND STRETCHING EXERCISES - Plantar Fasciitis (Heel Spur Syndrome) These exercises may help you when beginning to rehabilitate your injury. Your  symptoms may resolve with or without further involvement from your physician, physical therapist or athletic trainer. While completing these exercises, remember:  Restoring tissue flexibility helps normal motion to return to the joints. This allows healthier, less painful movement and activity. An effective stretch should be held for at least 30 seconds. A stretch should never be painful. You should only feel a gentle lengthening or release in the stretched tissue.  RANGE OF MOTION - Toe Extension, Flexion Sit with your right / left leg crossed over your opposite knee. Grasp your toes and gently pull them back toward the top of your foot. You should feel a stretch on the bottom of your toes and/or foot. Hold this stretch for 10 seconds. Now, gently pull your toes toward the bottom of your foot. You should feel a stretch on the top of your toes and or foot. Hold this stretch for 10 seconds. Repeat  times. Complete this stretch 3 times per day.   RANGE OF MOTION - Ankle Dorsiflexion, Active Assisted Remove shoes and sit on a chair that is preferably not on a carpeted surface. Place right / left foot under knee. Extend your opposite leg for support. Keeping your heel down, slide your right / left foot back toward the chair until you feel a stretch at your ankle or calf. If you do not feel a stretch, slide your bottom forward to the edge of the chair, while still keeping your heel down. Hold this stretch for 10 seconds. Repeat 3 times. Complete this stretch 2 times per day.   STRETCH  Gastroc, Standing Place hands on wall. Extend right / left leg, keeping the front knee somewhat bent. Slightly point your toes inward on your back foot. Keeping your right / left heel on the floor and your knee straight, shift your weight toward the wall, not allowing your back to arch. You should feel a gentle stretch in the right / left calf. Hold this position for 10 seconds. Repeat 3 times. Complete this  stretch 2 times per day.  STRETCH  Soleus, Standing Place hands on wall. Extend right / left leg, keeping the other knee somewhat bent. Slightly point your toes inward on your back foot. Keep your right / left heel on the floor, bend your back knee, and slightly shift your weight over the back leg so that you feel a gentle stretch deep in your back calf. Hold this position for 10 seconds. Repeat 3 times. Complete this stretch 2 times per day.  STRETCH  Gastrocsoleus, Standing  Note: This exercise can place a lot of stress on your foot and ankle. Please complete this exercise only if specifically instructed by your caregiver.  Place the ball of your right / left foot on a step, keeping your other foot firmly on the same step. Hold on to the wall or a rail for balance. Slowly lift your other foot, allowing your body weight to press your heel down over the edge of the step. You should feel a stretch in your right / left calf. Hold this position for 10 seconds. Repeat this exercise with a slight bend in your right / left knee. Repeat 3 times. Complete this stretch 2 times per day.   STRENGTHENING EXERCISES - Plantar Fasciitis (Heel Spur Syndrome)  These exercises may help you when beginning to rehabilitate your injury. They may resolve your symptoms with or without further involvement from your physician, physical therapist or athletic trainer. While completing these exercises, remember:  Muscles can  gain both the endurance and the strength needed for everyday activities through controlled exercises. Complete these exercises as instructed by your physician, physical therapist or athletic trainer. Progress the resistance and repetitions only as guided.  STRENGTH - Towel Curls Sit in a chair positioned on a non-carpeted surface. Place your foot on a towel, keeping your heel on the floor. Pull the towel toward your heel by only curling your toes. Keep your heel on the floor. Repeat 3 times.  Complete this exercise 2 times per day.  STRENGTH - Ankle Inversion Secure one end of a rubber exercise band/tubing to a fixed object (table, pole). Loop the other end around your foot just before your toes. Place your fists between your knees. This will focus your strengthening at your ankle. Slowly, pull your big toe up and in, making sure the band/tubing is positioned to resist the entire motion. Hold this position for 10 seconds. Have your muscles resist the band/tubing as it slowly pulls your foot back to the starting position. Repeat 3 times. Complete this exercises 2 times per day.  Document Released: 03/30/2005 Document Revised: 06/22/2011 Document Reviewed: 07/12/2008 [facility] Patient Information 2014 [place], [place].  While at your visit today you received a steroid injection in your foot or ankle to help with your pain. Along with having the steroid medication there is some "numbing" medication in the shot that you received. Due to this you may notice some numbness to the area for the next couple of hours.   I would recommend limiting activity for the next few days to help the steroid injection take affect.    The actually benefit from the steroid injection may take up to 2-7 days to see a difference. You may actually experience a small (as in 10%) INCREASE in pain in the first 24 hours---that is common. It would be best if you can ice the area today and take anti-inflammatory medications (such as Ibuprofen, Motrin, or Aleve) if you are able to take these medications. If you were prescribed another medication to help with the pain go ahead and start that medication today    Things to watch out for that you should contact us or a health care provider urgently would include: 1. Unusual (as in more than 10%) increase in pain 2. New fever > 101.5 3. New swelling or redness of the injected area.  4. Streaking of red lines around the area injected.  If you have any questions or concerns  about this, please give our office a call at [phone].

## 2024-01-18 NOTE — Progress Notes (Unsigned)
 Subjective:  Patient ID: Heidi Richards, female    DOB: 12-02-1945,  MRN: [id number]  Chief Complaint  Patient presents with   Foot Pain    Bilateral foot pain  Pt stated that this has been going on for a while now she stated that it is worse in the morning but gets better throughout the day as she starts to walk    78 y.o. female presents with the above complaint.  Patient presents with right heel pain has been on for quite some time.  She states that is worse in the morning gets better throughout the day and then progressively gets worse.  She wants to discuss treatment options for pain Are Tender Aching Lysle Slain with Ambulation Is with Pressure Right Side Is the Worst Side.   Review of Systems: Negative except as noted in the HPI. Denies N/V/F/Ch.  Past Medical History:  Diagnosis Date   Bladder cancer (HCC) 2006   Carbon monoxide exposure    Dysplastic nevus 09/16/2009   Right post. shoulder. Severe atypia, close to margin. Excised 10/09/2009, margins free.   History of elevated lipids    Hypothyroidism    Obesity    Personal history of chemotherapy 2006   PONV (postoperative nausea and vomiting)    nausea and vomiting   Pre-diabetes    SVT (supraventricular tachycardia)    Thyroid disease    hypothyroid'    Current Outpatient Medications:    amLODipine (NORVASC) 2.5 MG tablet, Take 1 tablet (2.5 mg) by mouth once daily at night, Disp: , Rfl:    aspirin 81 MG EC tablet, Take 81 mg by mouth daily., Disp: , Rfl:    Calcium Carb-Cholecalciferol (CALCIUM + D3 PO), Take by mouth. daily, Disp: , Rfl:    calcium carbonate (TUMS EX) 750 MG chewable tablet, Chew 1 tablet by mouth as needed., Disp: , Rfl:    ezetimibe  (ZETIA ) 10 MG tablet, TAKE 1 TABLET BY MOUTH EVERY DAY, Disp: 90 tablet, Rfl: 3   fexofenadine (ALLEGRA) 180 MG tablet, Take 180 mg by mouth as needed., Disp: , Rfl:    furosemide  (LASIX ) 20 MG tablet, TAKE 1 TABLET BY MOUTH EVERY DAY AS NEEDED, Disp: 90 tablet,  Rfl: 1   hydrochlorothiazide (HYDRODIURIL) 12.5 MG tablet, Take 12.5 mg by mouth daily., Disp: , Rfl:    ketoconazole  (NIZORAL ) 2 % shampoo, Apply 1 Application topically as directed. Wash scalp, let sit 5 minutes and rinse out (Patient taking differently: Apply 1 Application topically as needed. Wash scalp, let sit 5 minutes and rinse out), Disp: 120 mL, Rfl: 11   levothyroxine (SYNTHROID, LEVOTHROID) 88 MCG tablet, Take 88 mcg by mouth daily., Disp: , Rfl:    losartan  (COZAAR ) 100 MG tablet, Take 1 tablet by mouth daily., Disp: , Rfl:    metFORMIN (GLUCOPHAGE) 500 MG tablet, Take 1 tablet by mouth daily with breakfast., Disp: , Rfl:    Probiotic Product (ALIGN) 4 MG CAPS, Take by mouth as needed., Disp: , Rfl:    triamcinolone  cream (KENALOG ) 0.1 %, Apply 1 Application topically as directed. Apply to lower legs once daily on Friday, Saturday and Sunday as needed for flares, avoid face, groin, axilla (Patient taking differently: Apply 1 Application topically as needed. Apply to lower legs once daily on Friday, Saturday and Sunday as needed for flares, avoid face, groin, axilla), Disp: 80 g, Rfl: 3  Social History   Tobacco Use  Smoking Status Former   Current packs/day: 0.00   Types: Cigarettes  Quit date: 04/13/1985   Years since quitting: 38.8  Smokeless Tobacco Never    Allergies  Allergen Reactions   Ciprofloxacin Swelling and Other (See Comments)   Fioricet [Butalbital-Apap-Caffeine]    Gold Dermatitis and Itching   Gold-Containing Drug Products Dermatitis   Misc. Sulfonamide Containing Compounds Other (See Comments)   Sulfa Antibiotics Nausea Only and Other (See Comments)   Objective:  There were no vitals filed for this visit. There is no height or weight on file to calculate BMI. Constitutional Well developed. Well nourished.  Vascular Dorsalis pedis pulses palpable bilaterally. Posterior tibial pulses palpable bilaterally. Capillary refill normal to all digits.  No  cyanosis or clubbing noted. Pedal hair growth normal.  Neurologic Normal speech. Oriented to person, place, and time. Epicritic sensation to light touch grossly present bilaterally.  Dermatologic Nails well groomed and normal in appearance. No open wounds. No skin lesions.  Orthopedic: Normal joint ROM without pain or crepitus bilaterally. No visible deformities. Tender to palpation at the calcaneal tuber .right. No pain with calcaneal squeeze right. Ankle ROM diminished range of motion right. Silfverskiold Test: positive right.   Radiographs: None  Assessment:   1. Plantar fasciitis of right foot    Plan:  Patient was evaluated and treated and all questions answered.  Plantar Fasciitis, right - XR reviewed as above.  - Educated on icing and stretching. Instructions given.  - Injection delivered to the plantar fascia as below. - DME: Plantar fascial brace dispensed to support the medial longitudinal arch of the foot and offload pressure from the heel and prevent arch collapse during weightbearing - Pharmacologic management: None  Procedure: Injection Tendon/Ligament Location: Right plantar fascia at the glabrous junction; medial approach. Skin Prep: alcohol Injectate: 0.5 cc 0.5% marcaine plain, 0.5 cc of 1% Lidocaine , 0.5 cc kenalog  10. Disposition: Patient tolerated procedure well. Injection site dressed with a band-aid.  Return in about 4 weeks (around 02/15/2024).

## 2024-02-15 ENCOUNTER — Ambulatory Visit: Admitting: Podiatry

## 2024-02-15 DIAGNOSIS — M722 Plantar fascial fibromatosis: Secondary | ICD-10-CM

## 2024-02-15 DIAGNOSIS — B351 Tinea unguium: Secondary | ICD-10-CM | POA: Diagnosis not present

## 2024-02-15 NOTE — Progress Notes (Unsigned)
 Subjective:  Patient ID: Heidi Richards, female    DOB: Aug 01, 1945,  MRN: [id number]  Chief Complaint  Patient presents with   Plantar Fasciitis    78 y.o. female presents with the above complaint.  Patient presents with follow-up for right plantar fasciitis she states that she is doing better the injection helped she would like to do another 1.  She would also like to discuss nail fungus treatment options.  Denies any other acute issues   Review of Systems: Negative except as noted in the HPI. Denies N/V/F/Ch.  Past Medical History:  Diagnosis Date   Bladder cancer (HCC) 2006   Carbon monoxide exposure    Dysplastic nevus 09/16/2009   Right post. shoulder. Severe atypia, close to margin. Excised 10/09/2009, margins free.   History of elevated lipids    Hypothyroidism    Obesity    Personal history of chemotherapy 2006   PONV (postoperative nausea and vomiting)    nausea and vomiting   Pre-diabetes    SVT (supraventricular tachycardia)    Thyroid disease    hypothyroid'    Current Outpatient Medications:    amLODipine (NORVASC) 2.5 MG tablet, Take 1 tablet (2.5 mg) by mouth once daily at night, Disp: , Rfl:    aspirin 81 MG EC tablet, Take 81 mg by mouth daily., Disp: , Rfl:    Calcium Carb-Cholecalciferol (CALCIUM + D3 PO), Take by mouth. daily, Disp: , Rfl:    calcium carbonate (TUMS EX) 750 MG chewable tablet, Chew 1 tablet by mouth as needed., Disp: , Rfl:    ezetimibe  (ZETIA ) 10 MG tablet, TAKE 1 TABLET BY MOUTH EVERY DAY, Disp: 90 tablet, Rfl: 3   fexofenadine (ALLEGRA) 180 MG tablet, Take 180 mg by mouth as needed., Disp: , Rfl:    furosemide  (LASIX ) 20 MG tablet, TAKE 1 TABLET BY MOUTH EVERY DAY AS NEEDED, Disp: 90 tablet, Rfl: 1   hydrochlorothiazide (HYDRODIURIL) 12.5 MG tablet, Take 12.5 mg by mouth daily., Disp: , Rfl:    ketoconazole  (NIZORAL ) 2 % shampoo, Apply 1 Application topically as directed. Wash scalp, let sit 5 minutes and rinse out (Patient taking  differently: Apply 1 Application topically as needed. Wash scalp, let sit 5 minutes and rinse out), Disp: 120 mL, Rfl: 11   levothyroxine (SYNTHROID, LEVOTHROID) 88 MCG tablet, Take 88 mcg by mouth daily., Disp: , Rfl:    losartan  (COZAAR ) 100 MG tablet, Take 1 tablet by mouth daily., Disp: , Rfl:    metFORMIN (GLUCOPHAGE) 500 MG tablet, Take 1 tablet by mouth daily with breakfast., Disp: , Rfl:    Probiotic Product (ALIGN) 4 MG CAPS, Take by mouth as needed., Disp: , Rfl:    triamcinolone  cream (KENALOG ) 0.1 %, Apply 1 Application topically as directed. Apply to lower legs once daily on Friday, Saturday and Sunday as needed for flares, avoid face, groin, axilla (Patient taking differently: Apply 1 Application topically as needed. Apply to lower legs once daily on Friday, Saturday and Sunday as needed for flares, avoid face, groin, axilla), Disp: 80 g, Rfl: 3  Social History   Tobacco Use  Smoking Status Former   Current packs/day: 0.00   Types: Cigarettes   Quit date: 04/13/1985   Years since quitting: 38.8  Smokeless Tobacco Never    Allergies  Allergen Reactions   Ciprofloxacin Swelling and Other (See Comments)   Fioricet [Butalbital-Apap-Caffeine]    Gold Dermatitis and Itching   Gold-Containing Drug Products Dermatitis   Misc. Sulfonamide Containing Compounds Other (  See Comments)   Sulfa Antibiotics Nausea Only and Other (See Comments)   Objective:  There were no vitals filed for this visit. There is no height or weight on file to calculate BMI. Constitutional Well developed. Well nourished.  Vascular Dorsalis pedis pulses palpable bilaterally. Posterior tibial pulses palpable bilaterally. Capillary refill normal to all digits.  No cyanosis or clubbing noted. Pedal hair growth normal.  Neurologic Normal speech. Oriented to person, place, and time. Epicritic sensation to light touch grossly present bilaterally.  Dermatologic Thickened elongated dystrophic mycotic toenails x  2 bilateral hallux mild pain on palpation onychomycosis noted No open wounds. No skin lesions.  Orthopedic: Normal joint ROM without pain or crepitus bilaterally. No visible deformities. Tender to palpation at the calcaneal tuber .right. No pain with calcaneal squeeze right. Ankle ROM diminished range of motion right. Silfverskiold Test: positive right.   Radiographs: None  Assessment:   1. Plantar fasciitis of right foot   2. Onychomycosis due to dermatophyte   3. Nail fungus     Plan:  Patient was evaluated and treated and all questions answered.  Bilateral hallux onychomycosis -Educated the patient on the etiology of onychomycosis and various treatment options associated with improving the fungal load.  I explained to the patient that there is 3 treatment options available to treat the onychomycosis including topical, p.o., laser treatment.  Patient is scheduled for laser therapy.  Should be scheduled for laser therapy for 6 or 7 sessions  Plantar Fasciitis, right - XR reviewed as above.  - Educated on icing and stretching. Instructions given.  - Second injection delivered to the plantar fascia as below. - DME: Plantar fascial brace dispensed to support the medial longitudinal arch of the foot and offload pressure from the heel and prevent arch collapse during weightbearing - Pharmacologic management: None  Procedure: Injection Tendon/Ligament Location: Right plantar fascia at the glabrous junction; medial approach. Skin Prep: alcohol Injectate: 0.5 cc 0.5% marcaine plain, 0.5 cc of 1% Lidocaine , 0.5 cc kenalog  10. Disposition: Patient tolerated procedure well. Injection site dressed with a band-aid.  No follow-ups on file.

## 2024-04-18 ENCOUNTER — Ambulatory Visit (INDEPENDENT_AMBULATORY_CARE_PROVIDER_SITE_OTHER): Payer: Self-pay

## 2024-04-18 DIAGNOSIS — B351 Tinea unguium: Secondary | ICD-10-CM

## 2024-04-18 NOTE — Progress Notes (Signed)
 Patient presents today for the 1st laser treatment. Diagnosed with mycotic nail infection by Dr. Tobie.   Toenail most affected Bilateral Great toe nails.  All other systems are negative.  Nails were trimmed and filed thin. Laser therapy was administered to 2 toenails BL and patient tolerated the treatment well. All safety precautions were in place.   Post treatment instructions reviewed and provided to patient. Patient had no questions regarding plan of care.   Follow up in 4 weeks for laser # 2.

## 2024-04-18 NOTE — Patient Instructions (Signed)

## 2024-06-09 ENCOUNTER — Ambulatory Visit

## 2024-07-07 ENCOUNTER — Ambulatory Visit

## 2024-07-13 ENCOUNTER — Ambulatory Visit: Admitting: Medical

## 2024-08-10 ENCOUNTER — Ambulatory Visit: Admitting: Dermatology

## 2024-08-18 ENCOUNTER — Ambulatory Visit

## 2024-09-29 ENCOUNTER — Ambulatory Visit

## 2024-11-24 ENCOUNTER — Ambulatory Visit
